# Patient Record
Sex: Male | Born: 1995 | Race: Black or African American | Hispanic: No | Marital: Single
Health system: Southern US, Community
[De-identification: ages and names within clinical notes are randomized; demographics above are authoritative.]

## PROBLEM LIST (undated history)

## (undated) ENCOUNTER — Emergency Department (HOSPITAL_COMMUNITY): Admission: EM | Payer: Self-pay | Source: Home / Self Care

## (undated) DIAGNOSIS — S069XAA Unspecified intracranial injury with loss of consciousness status unknown, initial encounter: Secondary | ICD-10-CM

## (undated) DIAGNOSIS — R569 Unspecified convulsions: Principal | ICD-10-CM

## (undated) DIAGNOSIS — Y249XXA Unspecified firearm discharge, undetermined intent, initial encounter: Secondary | ICD-10-CM

## (undated) DIAGNOSIS — S069X9A Unspecified intracranial injury with loss of consciousness of unspecified duration, initial encounter: Secondary | ICD-10-CM

## (undated) DIAGNOSIS — W3400XA Accidental discharge from unspecified firearms or gun, initial encounter: Secondary | ICD-10-CM

## (undated) DIAGNOSIS — J45909 Unspecified asthma, uncomplicated: Secondary | ICD-10-CM

## (undated) HISTORY — PX: OTHER SURGICAL HISTORY: SHX169

## (undated) HISTORY — PX: BRAIN SURGERY: SHX531

## (undated) HISTORY — PX: ROTATOR CUFF REPAIR: SHX139

## (undated) HISTORY — PX: ANKLE SURGERY: SHX546

---
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
## 2013-04-17 ENCOUNTER — Other Ambulatory Visit (HOSPITAL_COMMUNITY)
Admission: RE | Admit: 2013-04-17 | Discharge: 2013-04-17 | Disposition: A | Discharge status: Home / Self Care | Payer: Medicaid Other | Source: Ambulatory Visit | Attending: Family Medicine | Admitting: Family Medicine

## 2013-04-17 ENCOUNTER — Encounter (HOSPITAL_COMMUNITY): Payer: Self-pay | Admitting: Emergency Medicine

## 2013-04-17 ENCOUNTER — Emergency Department (INDEPENDENT_AMBULATORY_CARE_PROVIDER_SITE_OTHER)
Admission: EM | Admit: 2013-04-17 | Discharge: 2013-04-17 | Disposition: A | Discharge status: Home / Self Care | Payer: Self-pay | Source: Home / Self Care

## 2013-04-17 DIAGNOSIS — Z113 Encounter for screening for infections with a predominantly sexual mode of transmission: Secondary | ICD-10-CM | POA: Insufficient documentation

## 2013-04-17 DIAGNOSIS — Z202 Contact with and (suspected) exposure to infections with a predominantly sexual mode of transmission: Secondary | ICD-10-CM

## 2013-04-17 LAB — POCT URINALYSIS DIP (DEVICE)
Bilirubin Urine: NEGATIVE
GLUCOSE, UA: NEGATIVE mg/dL
HGB URINE DIPSTICK: NEGATIVE
Ketones, ur: NEGATIVE mg/dL
Leukocytes, UA: NEGATIVE
NITRITE: NEGATIVE
PH: 6.5 (ref 5.0–8.0)
Protein, ur: NEGATIVE mg/dL
Specific Gravity, Urine: >=1.03 (ref 1.005–1.030)
UROBILINOGEN UA: 0.2 mg/dL (ref 0.0–1.0)

## 2013-04-17 NOTE — ED Notes (Signed)
Pt wants to be screened for STD He is asymptomatic but is concerned b/c he had abd pressure about a week ago??? Reports he used a contaminated bathroom?? Alert w/no signs of acuate distress.

## 2013-04-17 NOTE — ED Notes (Signed)
Call back number verified.  

## 2013-04-17 NOTE — ED Provider Notes (Signed)
CSN: [id number]     Arrival date & time 04/17/13  1028 History   First MD Initiated Contact with Patient 04/17/13 1152     Chief Complaint  Patient presents with  . Exposure to STD   (Consider location/radiation/quality/duration/timing/severity/associated sxs/prior Treatment) HPI Comments: Last week pt sat down on a "dirty" toilet seat and the next day had episodes of lower abd/pelvic pressure with urination. No penile discharge or dysuria.  No current sx's.   Past Medical History  Diagnosis Date  . Asthma    Past Surgical History  Procedure Laterality Date  . Rotator cuff repair     No family history on file. History  Substance Use Topics  . Smoking status: Never Smoker   . Smokeless tobacco: Not on file  . Alcohol Use: No    Review of Systems  Genitourinary:       See HPI  All other systems reviewed and are negative.   Allergies  Review of patient's allergies indicates no known allergies.  Home Medications   Current Outpatient Rx  Name  Route  Sig  Dispense  Refill  . ibuprofen (ADVIL,MOTRIN) 800 MG tablet   Oral   Take 1 tablet (800 mg total) by mouth 3 (three) times daily.   21 tablet   0    BP 148/79  Pulse 78  Temp(Src) 97 F (36.1 C) (Oral)  Resp 16  SpO2 96% Physical Exam  Nursing note and vitals reviewed. Constitutional: He is oriented to person, place, and time. He appears well-developed and well-nourished. No distress.  Neck: Normal range of motion. Neck supple.  Pulmonary/Chest: Effort normal. He has no wheezes.  Abdominal: He exhibits no distension. There is no tenderness. There is no rebound and no guarding.  Neurological: He is alert and oriented to person, place, and time.  Skin: Skin is warm and dry.  Psychiatric: He has a normal mood and affect.    ED Course  Procedures (including critical care time) Labs Review Labs Reviewed  POCT URINALYSIS DIP (DEVICE)  URINE CYTOLOGY ANCILLARY ONLY   Imaging Review No results  found. Results for orders placed during the hospital encounter of 04/17/13  POCT URINALYSIS DIP (DEVICE)      Result Value Ref Range   Glucose, UA NEGATIVE  NEGATIVE mg/dL   Bilirubin Urine NEGATIVE  NEGATIVE   Ketones, ur NEGATIVE  NEGATIVE mg/dL   Specific Gravity, Urine >=1.030  1.005 - 1.030   Hgb urine dipstick NEGATIVE  NEGATIVE   pH 6.5  5.0 - 8.0   Protein, ur NEGATIVE  NEGATIVE mg/dL   Urobilinogen, UA 0.2  0.0 - 1.0 mg/dL   Nitrite NEGATIVE  NEGATIVE   Leukocytes, UA NEGATIVE  NEGATIVE     MDM   1. Possible exposure to STD     Cytology pending No known exposure and no convincing evidence of current STD Will call report and tx per phone as indicated.     Hayden Rasmussenavid Chanice Brenton, NP 04/17/13 1225

## 2013-04-19 LAB — URINE CYTOLOGY ANCILLARY ONLY
Chlamydia: NEGATIVE
Neisseria Gonorrhea: NEGATIVE
Trichomonas: NEGATIVE

## 2013-04-20 NOTE — ED Provider Notes (Signed)
Medical screening examination/treatment/procedure(s) were performed by a resident physician or non-physician practitioner and as the supervising physician I was immediately available for consultation/collaboration.  Chip Canepa, MD    Dellis Voght S Celestino Ackerman, MD 04/20/13 1636 

## 2013-06-10 ENCOUNTER — Encounter (HOSPITAL_COMMUNITY): Payer: Self-pay | Admitting: Emergency Medicine

## 2013-06-10 ENCOUNTER — Emergency Department (INDEPENDENT_AMBULATORY_CARE_PROVIDER_SITE_OTHER)
Admission: EM | Admit: 2013-06-10 | Discharge: 2013-06-10 | Disposition: A | Discharge status: Home / Self Care | Payer: Self-pay | Source: Home / Self Care | Attending: Emergency Medicine | Admitting: Emergency Medicine

## 2013-06-10 DIAGNOSIS — J00 Acute nasopharyngitis [common cold]: Secondary | ICD-10-CM

## 2013-06-10 LAB — POCT RAPID STREP A: Streptococcus, Group A Screen (Direct): NEGATIVE

## 2013-06-10 MED ORDER — IPRATROPIUM BROMIDE 0.06 % NA SOLN: 2.0000 | NASAL | Status: DC | PRN

## 2013-06-10 NOTE — Discharge Instructions (Signed)

## 2013-06-10 NOTE — ED Provider Notes (Signed)
CSN: [id number]     Arrival date & time 06/10/13  1126 History   None    Chief Complaint  Patient presents with  . URI   (Consider location/radiation/quality/duration/timing/severity/associated sxs/prior Treatment) HPI Comments: 18 year old male presents complaining of a mild to moderate sore throat, itching in the throat, dry cough, and rhinorrhea. His symptoms started 3 days ago. His daughter has been sick with similar symptoms. He denies fever, chills, NVD, body aches. Cough is dry, nonproductive. No chest pain or shortness of breath.  Patient is a 18 y.o. male presenting with URI.  URI Presenting symptoms: congestion, cough, rhinorrhea and sore throat   Presenting symptoms: no fatigue and no fever     Past Medical History  Diagnosis Date  . Asthma    Past Surgical History  Procedure Laterality Date  . Rotator cuff repair     History reviewed. No pertinent family history. History  Substance Use Topics  . Smoking status: Never Smoker   . Smokeless tobacco: Not on file  . Alcohol Use: No    Review of Systems  Constitutional: Negative for fever, chills and fatigue.  HENT: Positive for congestion, rhinorrhea and sore throat.   Respiratory: Positive for cough. Negative for chest tightness and shortness of breath.   All other systems reviewed and are negative.   Allergies  Review of patient's allergies indicates no known allergies.  Home Medications   Prior to Admission medications   Medication Sig Start Date End Date Taking? Authorizing Provider  ibuprofen (ADVIL,MOTRIN) 800 MG tablet Take 1 tablet (800 mg total) by mouth 3 (three) times daily. 10/10/12   Fayrene Helper, PA-C  ipratropium (ATROVENT) 0.06 % nasal spray Place 2 sprays into both nostrils every 4 (four) hours as needed for rhinitis. 06/10/13   Adrian Blackwater Layza Summa, PA-C   BP 149/82  Pulse 64  Temp(Src) 97.5 F (36.4 C) (Oral)  Resp 16  SpO2 100% Physical Exam  Nursing note and vitals reviewed. Constitutional: He  is oriented to person, place, and time. He appears well-developed and well-nourished. No distress.  HENT:  Head: Normocephalic and atraumatic.  Nose: Nasal deformity (mass off the lateral portion of the left nostril, 1 cm superior from the opening of the naris ) present.  Mouth/Throat: Uvula is midline. Posterior oropharyngeal erythema present. Oropharyngeal exudate: embedded tonsil stones, but no exudate.  Cardiovascular: Normal rate, regular rhythm and normal heart sounds.   Pulmonary/Chest: Effort normal and breath sounds normal. No respiratory distress.  Lymphadenopathy:       Head (right side): Tonsillar adenopathy present.       Head (left side): Tonsillar adenopathy present.    He has no cervical adenopathy.  Neurological: He is alert and oriented to person, place, and time. Coordination normal.  Skin: Skin is warm and dry. No rash noted. He is not diaphoretic.  Psychiatric: He has a normal mood and affect. Judgment normal.    ED Course  Procedures (including critical care time) Labs Review Labs Reviewed  POCT RAPID STREP A ([facility] CARE ONLY)    Imaging Review No results found.   MDM   1. Acute nasopharyngitis (common cold)    Referring to ENT for evaluation of the mass in the left nostril. As for his current complaint, he has a cold. We'll prescribe Atrovent nasal spray to use as needed for runny nose. Followup if worsening   Meds ordered this encounter  Medications  . ipratropium (ATROVENT) 0.06 % nasal spray    Sig: Place 2  sprays into both nostrils every 4 (four) hours as needed for rhinitis.    Dispense:  15 mL    Refill:  1    Order Specific Question:  Supervising Provider    Answer:  Bradd CanaryKINDL, JAMES D [[id number]]       Graylon GoodZachary H Zach Tietje, PA-C 06/10/13 1314

## 2013-06-10 NOTE — ED Notes (Signed)
C/o   Sore/scratchy throat.  Cough.  Runny nose.  Symptoms present since 6/1.  Denies vomiting and diarrhea.

## 2013-06-11 NOTE — ED Provider Notes (Signed)
Medical screening examination/treatment/procedure(s) were performed by a resident physician or non-physician practitioner and as the supervising physician I was immediately available for consultation/collaboration.  Evan Corey, MD    Evan S Corey, MD 06/11/13 0743 

## 2013-06-12 LAB — CULTURE, GROUP A STREP

## 2013-07-12 ENCOUNTER — Emergency Department (HOSPITAL_COMMUNITY): Payer: Medicaid Other

## 2013-07-12 ENCOUNTER — Emergency Department (HOSPITAL_COMMUNITY)
Admission: EM | Admit: 2013-07-12 | Discharge: 2013-07-12 | Disposition: A | Discharge status: Home / Self Care | Payer: Medicaid Other | Attending: Emergency Medicine | Admitting: Emergency Medicine

## 2013-07-12 ENCOUNTER — Encounter (HOSPITAL_COMMUNITY): Payer: Self-pay | Admitting: Emergency Medicine

## 2013-07-12 DIAGNOSIS — Z791 Long term (current) use of non-steroidal anti-inflammatories (NSAID): Secondary | ICD-10-CM | POA: Diagnosis not present

## 2013-07-12 DIAGNOSIS — M79609 Pain in unspecified limb: Secondary | ICD-10-CM | POA: Diagnosis present

## 2013-07-12 DIAGNOSIS — Z79899 Other long term (current) drug therapy: Secondary | ICD-10-CM | POA: Insufficient documentation

## 2013-07-12 DIAGNOSIS — J45909 Unspecified asthma, uncomplicated: Secondary | ICD-10-CM | POA: Insufficient documentation

## 2013-07-12 DIAGNOSIS — M25579 Pain in unspecified ankle and joints of unspecified foot: Secondary | ICD-10-CM | POA: Diagnosis not present

## 2013-07-12 DIAGNOSIS — M25572 Pain in left ankle and joints of left foot: Secondary | ICD-10-CM

## 2013-07-12 DIAGNOSIS — M722 Plantar fascial fibromatosis: Secondary | ICD-10-CM | POA: Diagnosis not present

## 2013-07-12 MED ORDER — MELOXICAM 7.5 MG PO TABS: 7.5000 mg | ORAL_TABLET | Freq: Every day | ORAL | Status: DC

## 2013-07-12 MED ORDER — MELOXICAM 15 MG PO TABS: 15.0000 mg | ORAL_TABLET | Freq: Every day | ORAL | Status: DC

## 2013-07-12 MED ORDER — IBUPROFEN 400 MG PO TABS
800.0000 mg | ORAL_TABLET | Freq: Once | ORAL | Status: AC
  Administered 2013-07-12: 800 mg via ORAL
  Filled 2013-07-12: qty 2

## 2013-07-12 NOTE — ED Notes (Addendum)
Per pt sts left foot pain for the past few days with swelling denies injury. No redness or warmth. sts that the pain is in his heel when he walks.

## 2013-07-12 NOTE — ED Provider Notes (Signed)
Medical screening examination/treatment/procedure(s) were performed by non-physician practitioner and as supervising physician I was immediately available for consultation/collaboration.   EKG Interpretation None        Torben Soloway M Tatiyana Foucher, MD 07/12/13 1534 

## 2013-07-12 NOTE — ED Notes (Signed)
Pt reports he woke 2 days ago with left foot/ankle swelling and pain to heel and arch of foot. No known injury. Foot significantly swollen.

## 2013-07-12 NOTE — Discharge Instructions (Signed)
Please read and follow all provided instructions.  Your diagnoses today include:  1. Plantar fasciitis of left foot   2. Left ankle pain     Tests performed today include:  An x-ray of the affected area - does NOT show any broken bones  Vital signs. See below for your results today.   Medications prescribed:   Meloxicam - anti-inflammatory pain medication  You have been prescribed an anti-inflammatory medication or NSAID. Take with food. Do not take aspirin, ibuprofen, or naproxen if taking this medication. Take smallest effective dose for the shortest duration needed for your pain. Stop taking if you experience stomach pain or vomiting.   Take any prescribed medications only as directed.  Home care instructions:   Follow any educational materials contained in this packet  Follow R.I.C.E. Protocol:  R - rest your injury   I  - use ice on injury without applying directly to skin  C - compress injury with bandage or splint  E - elevate the injury as much as possible  Follow-up instructions: Please follow-up with your primary care provider if you continue to have significant pain or trouble walking in 1 week. In this case you may have a severe injury that requires further care.   Return instructions:   Please return if your toes are numb or tingling, appear gray or blue, or you have severe pain (also elevate leg and loosen splint or wrap if you were given one)  Please return to the Emergency Department if you experience worsening symptoms.   Please return if you have any other emergent concerns.  Additional Information:  Your vital signs today were: BP 133/71   Pulse 74   Temp(Src) 98.2 F (36.8 C)   Resp 18   SpO2 100% If your blood pressure (BP) was elevated above 135/85 this visit, please have this repeated by your doctor within one month. --------------

## 2013-07-12 NOTE — ED Provider Notes (Signed)
CSN: [id number]     Arrival date & time 07/12/13  0854 History  This chart was scribed for non-physician practitioner Thomas CriglerJoshua Burnis Carter working with Thomas SkeensJoshua M Zavitz, MD by Thomas Carter, ED Scribe. This patient was seen in room TR05C/TR05C and the patient's care was started at 9:23 AM.    Chief Complaint  Patient presents with  . Foot Pain    HPI Comments: Thomas Carter is a 18 y.o. male who presents to the Emergency Department complaining of constant left foot pain that started 3 days ago after the patient woke up and noticed his foot was swollen.  He states that the swelling has gradually improved over the last 3 days.  The patient denies any injury and states that he can apply weight to his foot but experiences a pain that he rates as a 7/10.  He denies taking any medication for his symptoms.  He states that he works in [employer]eron[employer].     Patient is a 18 y.o. male presenting with lower extremity pain. The history is provided by the patient. No language interpreter was used.  Foot Pain    Past Medical History  Diagnosis Date  . Asthma    Past Surgical History  Procedure Laterality Date  . Rotator cuff repair     History reviewed. No pertinent family history. History  Substance Use Topics  . Smoking status: Never Smoker   . Smokeless tobacco: Not on file  . Alcohol Use: No    Review of Systems  Constitutional: Negative for activity change.  Musculoskeletal: Positive for arthralgias, gait problem and joint swelling. Negative for back pain and neck pain.  Skin: Negative for wound.  Neurological: Negative for weakness and numbness.      Allergies  Review of patient's allergies indicates no known allergies.  Home Medications   Prior to Admission medications   Medication Sig Start Date End Date Taking? Authorizing Provider  ibuprofen (ADVIL,MOTRIN) 800 MG tablet Take 1 tablet (800 mg total) by mouth 3 (three) times daily. 10/10/12   Thomas HelperBowie Tran, PA-C  ipratropium (ATROVENT) 0.06 %  nasal spray Place 2 sprays into both nostrils every 4 (four) hours as needed for rhinitis. 06/10/13   Thomas GoodZachary H Baker, PA-C   Triage Vitals: BP 133/71  Pulse 74  Temp(Src) 98.2 F (36.8 C)  Resp 18  SpO2 100%  Physical Exam  Nursing note and vitals reviewed. Constitutional: He is oriented to person, place, and time. He appears well-developed and well-nourished.  HENT:  Head: Normocephalic and atraumatic.  Eyes: Conjunctivae and EOM are normal.  Neck: Normal range of motion. Neck supple.  Cardiovascular: Normal rate and normal pulses.   Pulses:      Dorsalis pedis pulses are 2+ on the left side.  Pulmonary/Chest: Effort normal.  Musculoskeletal: He exhibits tenderness. He exhibits no edema.       Left knee: Normal.       Left ankle: He exhibits decreased range of motion and swelling. Tenderness. Lateral malleolus tenderness found. No proximal fibula tenderness found. Achilles tendon normal.       Left lower leg: Normal.       Left foot: He exhibits decreased range of motion and tenderness. He exhibits no bony tenderness.       Feet:  Neurological: He is alert and oriented to person, place, and time. No sensory deficit.  Motor, sensation, and vascular distal to the injury is fully intact.   Skin: Skin is warm and dry.  Psychiatric: He has  a normal mood and affect. His behavior is normal.    ED Course  Procedures (including critical care time)  DIAGNOSTIC STUDIES: Oxygen Saturation is 100% on room air, normal by my interpretation.    COORDINATION OF CARE: 9:40 AM-Discussed a clinical suspicion of inflammation in the plantar fascia and obtaining an x-ray of the patient's left foot.  Advised the patient to elevate his left foot throughout the day and at night, take antiinflammatory medication, and do gentle stretching exercises.  The patient agreed to the treatment plan.   Labs Review Labs Reviewed - No data to display  Imaging Review No results found.   EKG  Interpretation None      Vital signs reviewed and are as follows: Filed Vitals:   07/12/13 0917  BP: 133/71  Pulse: 74  Temp: 98.2 F (36.8 C)  Resp: 18   Pt informed of x-ray findings. Counseled on RICE, NSAIDs.   PCP f/u if not improved in 1 week.   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 However patient does have moderate swelling to his ankle and for this reason an x-ray was ordered. X-rays negative. Will treat patient for plantar fasciitis. He was counseled on rice protocol. PCP followup in one week if not improved.  I personally performed the services described in this documentation, which was scribed in my presence. The recorded information has been reviewed and is accurate.    Thomas CriglerJoshua Gaile Allmon, PA-C 07/12/13 1048

## 2013-08-04 ENCOUNTER — Encounter (HOSPITAL_COMMUNITY): Payer: Self-pay | Admitting: Emergency Medicine

## 2013-08-04 ENCOUNTER — Emergency Department (HOSPITAL_COMMUNITY)
Admission: EM | Admit: 2013-08-04 | Discharge: 2013-08-04 | Disposition: A | Discharge status: Home / Self Care | Payer: Medicaid Other | Attending: Emergency Medicine | Admitting: Emergency Medicine

## 2013-08-04 DIAGNOSIS — J45909 Unspecified asthma, uncomplicated: Secondary | ICD-10-CM | POA: Diagnosis not present

## 2013-08-04 DIAGNOSIS — R269 Unspecified abnormalities of gait and mobility: Secondary | ICD-10-CM | POA: Diagnosis not present

## 2013-08-04 DIAGNOSIS — M79609 Pain in unspecified limb: Secondary | ICD-10-CM | POA: Diagnosis present

## 2013-08-04 DIAGNOSIS — M7989 Other specified soft tissue disorders: Secondary | ICD-10-CM

## 2013-08-04 LAB — CBC
HEMATOCRIT: 43.8 % (ref 39.0–52.0)
HEMOGLOBIN: 15.4 g/dL (ref 13.0–17.0)
MCH: 30.5 pg (ref 26.0–34.0)
MCHC: 35.2 g/dL (ref 30.0–36.0)
MCV: 86.7 fL (ref 78.0–100.0)
Platelets: 218 10*3/uL (ref 150–400)
RBC: 5.05 MIL/uL (ref 4.22–5.81)
RDW: 12.7 % (ref 11.5–15.5)
WBC: 6.5 10*3/uL (ref 4.0–10.5)

## 2013-08-04 LAB — BASIC METABOLIC PANEL
ANION GAP: 14 (ref 5–15)
BUN: 13 mg/dL (ref 6–23)
CHLORIDE: 103 meq/L (ref 96–112)
CO2: 23 meq/L (ref 19–32)
CREATININE: 1.11 mg/dL (ref 0.50–1.35)
Calcium: 9.2 mg/dL (ref 8.4–10.5)
GFR calc Af Amer: >90 mL/min (ref >90)
GFR calc non Af Amer: >90 mL/min (ref >90)
Glucose, Bld: 89 mg/dL (ref 70–99)
Potassium: 4.1 mEq/L (ref 3.7–5.3)
Sodium: 140 mEq/L (ref 137–147)

## 2013-08-04 MED ORDER — ENOXAPARIN SODIUM 120 MG/0.8ML ~~LOC~~ SOLN
120.0000 mg | Freq: Once | SUBCUTANEOUS | Status: AC
  Administered 2013-08-04: 120 mg via SUBCUTANEOUS
  Filled 2013-08-04: qty 0.8

## 2013-08-04 NOTE — ED Notes (Signed)
Pt was seen here on 7/6 for left ankle pain and swelling. Reports taking meds as prescribed but still having swelling and pain when ambulating. No redness or warmth noted to leg, denies fever.

## 2013-08-04 NOTE — ED Notes (Signed)
Pt. Verbalized understanding of needing to return tomorrow morning for doppler study.

## 2013-08-04 NOTE — ED Provider Notes (Signed)
CSN: [id number]     Arrival date & time 08/04/13  1700 History  This chart was scribed for Mellody Drown, PA-C working with Flint Melter, MD by Evon Slack, ED Scribe. This patient was seen in room TR09C/TR09C and the patient's care was started at 6:45 PM.    Chief Complaint  Patient presents with  . Leg Swelling  . Leg Pain   HPI Comments: Thomas Carter is a 18 y.o. male who presents to the Emergency Department complaining of left leg swelling onset 07/10/2013. He denies history of gout. He states he took ibuprofen with for 1 week with no relief. He states that he has pain while walking. He denies any injury to the ankle. He states the he feels some tingling every time the foot is touched. He states the swelling has decreased but also thinks that it has traveled up his leg. No recent travel, family history or personal history of DVT/PE, lower extremity swelling, smoking, cancer, or exogenous estrogen.    Patient is a 18 y.o. male presenting with leg pain. The history is provided by the patient. No language interpreter was used.  Leg Pain Associated symptoms: no fever      Past Medical History  Diagnosis Date  . Asthma    Past Surgical History  Procedure Laterality Date  . Rotator cuff repair     History reviewed. No pertinent family history. History  Substance Use Topics  . Smoking status: Never Smoker   . Smokeless tobacco: Not on file  . Alcohol Use: No    Review of Systems  Constitutional: Negative for fever and chills.  Respiratory: Negative for shortness of breath.   Cardiovascular: Positive for leg swelling. Negative for chest pain and palpitations.  Musculoskeletal: Positive for arthralgias and gait problem.  Skin: Negative for color change, rash and wound.  Neurological: Negative for numbness.   Allergies  Review of patient's allergies indicates no known allergies.  Home Medications   Prior to Admission medications   Not on File   Triage Vitals: BP  141/74  Pulse 87  Temp(Src) 98.1 F (36.7 C) (Oral)  Resp 18  SpO2 98%  Physical Exam  Nursing note and vitals reviewed. Constitutional: He is oriented to person, place, and time. He appears well-developed and well-nourished.  Non-toxic appearance. He does not have a sickly appearance. He does not appear ill. No distress.  HENT:  Head: Normocephalic and atraumatic.  Eyes: Conjunctivae and EOM are normal.  Neck: Neck supple.  Pulmonary/Chest: Effort normal. No respiratory distress.  Musculoskeletal: Normal range of motion.  Left lower edema extending to mid tibia. Good cap refill, normal sensation to leg and toes, no obvious deformity, no obvious lesions, no increase in temperature to touch, no overlying erythema.   Neurological: He is alert and oriented to person, place, and time.  Skin: Skin is warm and dry. He is not diaphoretic.  Psychiatric: He has a normal mood and affect. His behavior is normal.    ED Course  Procedures (including critical care time) DIAGNOSTIC STUDIES: Oxygen Saturation is 98% on RA, normal by my interpretation.    COORDINATION OF CARE: 6:51 PM-Discussed treatment plan which includes ultra sound of left leg with pt at bedside and pt agreed to plan.     Labs Review Labs Reviewed  BASIC METABOLIC PANEL  CBC    Imaging Review No results found.   EKG Interpretation None      MDM   Final diagnoses:  Left leg swelling  Pt presents with non-traumatic left leg swelling, persistent for several weeks.  No obvious cellulitis, no signs of joint infection. pain to plantar surface with ambulation with bear feet. US ordered. US order was placed prior to 1900, when US leaves, however the no one from US came to get the patient, called and there were no more staff in the office.  Discussed lovenox and return at 0800 tomorrow for US study. Discussed lab results, imaging results, and treatment plan with the patient. Return precautions given. Reports  understanding and no other concerns at this time.  Patient is stable for discharge at this time. Meds given in ED:  Medications  enoxaparin (LOVENOX) injection 120 mg (120 mg Subcutaneous Given 08/04/13 2125)    There are no discharge medications for this patient.  I personally performed the services described in this documentation, which was scribed in my presence. The recorded information has been reviewed and is accurate.      Clabe SealLauren M Cline Draheim, PA-C 08/06/13 [phone]

## 2013-08-04 NOTE — Discharge Instructions (Signed)
Return for your outpatient DVT study as indicated by instructions on discharge paper. Call for a follow up appointment with a Family or Primary Care Provider.  Return if Symptoms worsen, he develop shortness of breath, chest pain, worsening swelling in the leg, numbness or tingling to the leg. Take medication as prescribed.  Elevate your leg when you're not walking or standing. Ice 3-4 times a day.

## 2013-08-04 NOTE — Progress Notes (Addendum)
ANTICOAGULATION CONSULT NOTE - Initial Consult  Pharmacy Consult:  Lovenox Indication:  Rule out DVT  No Known Allergies  Patient Measurements: Height: 6\' 3"  (190.5 cm) Weight: 255 lb (115.667 kg) IBW/kg (Calculated) : 84.5  Vital Signs: Temp: 98.1 F (36.7 C) (07/29 1713) Temp src: Oral (07/29 1713) BP: 141/74 mmHg (07/29 1713) Pulse Rate: 87 (07/29 1713)  Labs: No results found for this basename: HGB, HCT, PLT, APTT, LABPROT, INR, HEPARINUNFRC, CREATININE, CKTOTAL, CKMB, TROPONINI,  in the last 72 hours  Estimated Creatinine Clearance: 158.3 ml/min (by C-G formula based on Cr of 1.03).   Medical History: Past Medical History  Diagnosis Date  . Asthma        Assessment: [age] YOM presented with ankle pain and swelling to receive one dose of Lovenox and return tomorrow for an ultrasound.  Labs pending collection.   Goal of Therapy:  Anti-Xa level 0.6-1 units/ml 4hrs after LMWH dose given Monitor platelets by anticoagulation protocol: Yes    Plan:  - After labs are drawn, give Lovenox 120mg  SQ x 1 - Further plans per PA/MD    Chelsea Aushuy D. Laney Potashang, PharmD, BCPS Pager:  [phone] 08/04/2013, 9:01 PM    ==================================   Addendum:  labs WNL    Yzabella Crunk D. Laney Potashang, PharmD, BCPS Pager:  [phone] - [phone] 08/04/2013, 9:57 PM

## 2013-08-05 ENCOUNTER — Ambulatory Visit (HOSPITAL_COMMUNITY)
Admission: RE | Admit: 2013-08-05 | Discharge: 2013-08-05 | Disposition: A | Discharge status: Home / Self Care | Payer: Medicaid Other | Source: Ambulatory Visit | Attending: Family Medicine | Admitting: Family Medicine

## 2013-08-05 DIAGNOSIS — M7989 Other specified soft tissue disorders: Secondary | ICD-10-CM | POA: Diagnosis not present

## 2013-08-05 DIAGNOSIS — M79609 Pain in unspecified limb: Secondary | ICD-10-CM | POA: Diagnosis present

## 2013-08-05 NOTE — Progress Notes (Signed)
*  Preliminary Results* Left lower extremity venous duplex completed. Left lower extremity is negative for deep vein thrombosis. There is no evidence of left Baker's cyst.  08/05/2013 2:40 PM  Gertie FeyMichelle Malekai Markwood, RVT, RDCS, RDMS

## 2013-08-06 NOTE — ED Provider Notes (Signed)
Medical screening examination/treatment/procedure(s) were performed by non-physician practitioner and as supervising physician I was immediately available for consultation/collaboration.   EKG Interpretation None       Demoni Gergen L Fiora Weill, MD 08/06/13 1553 

## 2013-08-16 ENCOUNTER — Encounter: Payer: Self-pay | Admitting: Internal Medicine

## 2013-08-16 ENCOUNTER — Ambulatory Visit: Payer: Medicaid Other | Attending: Internal Medicine | Admitting: Internal Medicine

## 2013-08-16 VITALS — BP 117/74 | HR 76 | Temp 97.8°F | Resp 16

## 2013-08-16 DIAGNOSIS — Z8709 Personal history of other diseases of the respiratory system: Secondary | ICD-10-CM

## 2013-08-16 DIAGNOSIS — M25476 Effusion, unspecified foot: Principal | ICD-10-CM | POA: Insufficient documentation

## 2013-08-16 DIAGNOSIS — F17200 Nicotine dependence, unspecified, uncomplicated: Secondary | ICD-10-CM | POA: Diagnosis not present

## 2013-08-16 DIAGNOSIS — M25472 Effusion, left ankle: Secondary | ICD-10-CM

## 2013-08-16 DIAGNOSIS — M25473 Effusion, unspecified ankle: Secondary | ICD-10-CM | POA: Diagnosis present

## 2013-08-16 DIAGNOSIS — IMO0001 Reserved for inherently not codable concepts without codable children: Secondary | ICD-10-CM

## 2013-08-16 HISTORY — DX: Nicotine dependence, unspecified, uncomplicated: F17.200

## 2013-08-16 HISTORY — DX: Reserved for inherently not codable concepts without codable children: IMO0001

## 2013-08-16 LAB — URIC ACID: Uric Acid, Serum: 6.5 mg/dL (ref 4.0–7.8)

## 2013-08-16 MED ORDER — IBUPROFEN 800 MG PO TABS: 800.0000 mg | ORAL_TABLET | Freq: Three times a day (TID) | ORAL | Status: DC | PRN

## 2013-08-16 NOTE — Progress Notes (Signed)
Pt here to f/u left leg swelling with negative DVT on u/s 08/05/13 Pt was given Lovenox injection and states he feels much better Denies injury. Swelling noted to left ankle and lower leg Denies sob or chest pain

## 2013-08-16 NOTE — Progress Notes (Signed)
Patient Demographics  Thomas Carter, is a 18 y.o. male  ZOX:[id number]  [id number]  DOB - Apr 29, 1995  CC:  Chief Complaint  Patient presents with  . Hospitalization Follow-up  . Leg Swelling       HPI: Thomas Carter is a 18 y.o. male here today to establish medical care.He recently went to the emergency room with symptoms of left lower extremity swelling for the last one month, EMR reviewed patient did not had any symptoms of cellulitis, patient had her an x-ray done which was negative for bony abnormality or degenerative changes also had an ultrasound done which is negative for DVT, patient denies any family history of DVT PE. Patient reports some improvement in the symptoms but still has persistent swelling, denies any fever chills chest and shortness of breath denies any orthopnea or PND, patient has history of asthma denies any wheezing, but smoke cigarettes, I have advised patient to quit smoking. Patient has history of right ankle surgery and has metallic screws and hence cannot get MRI. Patient has No headache, No chest pain, No abdominal pain - No Nausea, No new weakness tingling or numbness, No Cough - SOB.  No Known Allergies Past Medical History  Diagnosis Date  . Asthma    No current outpatient prescriptions on file prior to visit.   No current facility-administered medications on file prior to visit.   Family History  Problem Relation Age of Onset  . Diabetes Maternal Uncle    History   Social History  . Marital Status: Single    Spouse Name: N/A    Number of Children: N/A  . Years of Education: N/A   Occupational History  . Not on file.   Social History Main Topics  . Smoking status: Current Every Day Smoker -- 0.50 packs/day for 5 years    Types: Cigarettes  . Smokeless tobacco: Not on file  . Alcohol Use: Yes     Comment: socially  . Drug Use: No  . Sexual Activity: Yes   Other Topics Concern  . Not on file   Social History Narrative  . No  narrative on file    Review of Systems: Constitutional: Negative for fever, chills, diaphoresis, activity change, appetite change and fatigue. HENT: Negative for ear pain, nosebleeds, congestion, facial swelling, rhinorrhea, neck pain, neck stiffness and ear discharge.  Eyes: Negative for pain, discharge, redness, itching and visual disturbance. Respiratory: Negative for cough, choking, chest tightness, shortness of breath, wheezing and stridor.  Cardiovascular: Negative for chest pain, palpitations and leg swelling. Gastrointestinal: Negative for abdominal distention. Genitourinary: Negative for dysuria, urgency, frequency, hematuria, flank pain, decreased urine volume, difficulty urinating and dyspareunia.  Musculoskeletal: Negative for back pain, joint swelling, arthralgia and gait problem. Neurological: Negative for dizziness, tremors, seizures, syncope, facial asymmetry, speech difficulty, weakness, light-headedness, numbness and headaches.  Hematological: Negative for adenopathy. Does not bruise/bleed easily. Psychiatric/Behavioral: Negative for hallucinations, behavioral problems, confusion, dysphoric mood, decreased concentration and agitation.    Objective:   Filed Vitals:   08/16/13 0930  BP: 117/74  Pulse: 76  Temp: 97.8 F (36.6 C)  Resp: 16    Physical Exam: Constitutional: Patient appears well-developed and well-nourished. No distress. HENT: Normocephalic, atraumatic, External right and left ear normal. Oropharynx is clear and moist.  Eyes: Conjunctivae and EOM are normal. PERRLA, no scleral icterus. Neck: Normal ROM. Neck supple. No JVD. No tracheal deviation. No thyromegaly. CVS: RRR, S1/S2 +, no murmurs, no gallops, no carotid bruit.  Pulmonary: Effort  and breath sounds normal, no stridor, rhonchi, wheezes, rales.  Abdominal: Soft. BS +, no distension, tenderness, rebound or guarding.  Musculoskeletal: Normal range of motion. Left lower leg/ankle edema 1+ no  tender no warmth to touch. Neuro: Alert. Normal reflexes, muscle tone coordination. No cranial nerve deficit. Skin: Skin is warm and dry. No rash noted. Not diaphoretic. No erythema. No pallor. Psychiatric: Normal mood and affect. Behavior, judgment, thought content normal.  Lab Results  Component Value Date   WBC 6.5 08/04/2013   HGB 15.4 08/04/2013   HCT 43.8 08/04/2013   MCV 86.7 08/04/2013   PLT 218 08/04/2013   Lab Results  Component Value Date   CREATININE 1.11 08/04/2013   BUN 13 08/04/2013   NA 140 08/04/2013   K 4.1 08/04/2013   CL 103 08/04/2013   CO2 23 08/04/2013    No results found for this basename: HGBA1C   Lipid Panel  No results found for this basename: chol, trig, hdl, cholhdl, vldl, ldlcalc       Assessment and plan:   1. Left ankle swelling Left leg ultrasound negative for DVT, x-ray reported to have soft tissue swelling recommended to have an MRI but  patient has history of right ankle surgery with metal screws I have ordered CT scan for further evaluation, advised patient for compression leg elevation and ibuprofen when necessary for pain and swelling. Will also check uric acid  level. - CT Ankle Left W Wo Contrast; Future - ibuprofen (ADVIL,MOTRIN) 800 MG tablet; Take 1 tablet (800 mg total) by mouth every 8 (eight) hours as needed.  Dispense: 30 tablet; Refill: 1 - Uric Acid  2. History of asthma Asymptomatic  3. Smoking Advise patient to quit smoking   Return in about 3 months (around 11/16/2013).   Doris CheadleADVANI, Courvoisier Hamblen, MD

## 2013-08-17 ENCOUNTER — Telehealth: Payer: Self-pay | Admitting: Emergency Medicine

## 2013-08-17 NOTE — Telephone Encounter (Signed)
Left message for pt to call for lab results 

## 2013-08-17 NOTE — Telephone Encounter (Signed)
Message copied by Darlis LoanSMITH, JILL D on Tue Aug 17, 2013  4:36 PM ------      Message from: Doris CheadleADVANI, DEEPAK      Created: Tue Aug 17, 2013 12:07 PM       Call and let the patient know that his uric acid is in normal range ------

## 2013-12-10 ENCOUNTER — Emergency Department (INDEPENDENT_AMBULATORY_CARE_PROVIDER_SITE_OTHER)
Admission: EM | Admit: 2013-12-10 | Discharge: 2013-12-10 | Disposition: A | Discharge status: Home / Self Care | Payer: Medicaid Other | Source: Home / Self Care | Attending: Emergency Medicine | Admitting: Emergency Medicine

## 2013-12-10 ENCOUNTER — Encounter (HOSPITAL_COMMUNITY): Payer: Self-pay | Admitting: Emergency Medicine

## 2013-12-10 DIAGNOSIS — B9789 Other viral agents as the cause of diseases classified elsewhere: Principal | ICD-10-CM

## 2013-12-10 DIAGNOSIS — J069 Acute upper respiratory infection, unspecified: Secondary | ICD-10-CM

## 2013-12-10 MED ORDER — ALBUTEROL SULFATE HFA 108 (90 BASE) MCG/ACT IN AERS: 2.0000 | INHALATION_SPRAY | RESPIRATORY_TRACT | Status: DC | PRN

## 2013-12-10 MED ORDER — TRAMADOL HCL 50 MG PO TABS
50.0000 mg | ORAL_TABLET | Freq: Four times a day (QID) | ORAL | Status: DC | PRN
Start: 2013-12-10 — End: 2014-01-28

## 2013-12-10 NOTE — ED Notes (Signed)
Pt reports cough x2days that wakes him up from sleep.  He states he is coughing up some mucus. He denies any fever.

## 2013-12-10 NOTE — Discharge Instructions (Signed)
You have a virus. Take tramadol every 6 hours as needed for cough and pain. Use the albuterol every 4 hours as needed for coughing. You can use Chloraseptic spray or throat lozenges to help with the sore throat.  Once your congestion clears up, your throat will feel better.  If you develop fevers, trouble swallowing, or trouble breathing, please go to the emergency room.

## 2013-12-10 NOTE — ED Provider Notes (Signed)
CSN: [id number]     Arrival date & time 12/10/13  1353 History   First MD Initiated Contact with Patient 12/10/13 1407     Chief Complaint  Patient presents with  . Cough   (Consider location/radiation/quality/duration/timing/severity/associated sxs/prior Treatment) HPI  He is a 18 year old man here for evaluation of chest congestion. This is associated with some coughing, productive of mucus, and a sore throat. It is been going on for about 2 days. He also reports some discomfort in his chest when he coughs, described as a raw feeling. Denies any runny nose. No fevers or chills. No wheezing or shortness of breath. He does have a history of asthma, but has not used an inhaler in 3 or 4 years. His son was recently sick with a cold.  Past Medical History  Diagnosis Date  . Asthma    Past Surgical History  Procedure Laterality Date  . Rotator cuff repair    . Right ankle surgery      Family History  Problem Relation Age of Onset  . Diabetes Maternal Uncle    History  Substance Use Topics  . Smoking status: Current Every Day Smoker -- 0.50 packs/day for 5 years    Types: Cigarettes  . Smokeless tobacco: Not on file  . Alcohol Use: Yes     Comment: socially    Review of Systems  Constitutional: Negative for fever.  HENT: Positive for sore throat. Negative for congestion and rhinorrhea.   Respiratory: Positive for cough. Negative for shortness of breath and wheezing.   Cardiovascular: Positive for chest pain (with cough).    Allergies  Review of patient's allergies indicates no known allergies.  Home Medications   Prior to Admission medications   Medication Sig Start Date End Date Taking? Authorizing Provider  albuterol (PROVENTIL HFA;VENTOLIN HFA) 108 (90 BASE) MCG/ACT inhaler Inhale 2 puffs into the lungs every 4 (four) hours as needed for wheezing or shortness of breath (cough). 12/10/13   Charm RingsErin J Yanixan Mellinger, MD  ibuprofen (ADVIL,MOTRIN) 800 MG tablet Take 1 tablet (800 mg  total) by mouth every 8 (eight) hours as needed. 08/16/13   Doris Cheadleeepak Advani, MD  traMADol (ULTRAM) 50 MG tablet Take 1 tablet (50 mg total) by mouth every 6 (six) hours as needed (cough or pain). 12/10/13   Charm RingsErin J Gerard Cantara, MD   BP 115/69 mmHg  Pulse 79  Temp(Src) 98.6 F (37 C) (Oral)  Resp 18  SpO2 96% Physical Exam  Constitutional: He is oriented to person, place, and time. He appears well-developed and well-nourished. No distress.  HENT:  Head: Normocephalic and atraumatic.  Right Ear: External ear normal.  Left Ear: External ear normal.  Nose: Mucosal edema present. No rhinorrhea.  Mouth/Throat: Mucous membranes are not dry. Posterior oropharyngeal erythema present. No oropharyngeal exudate.  Neck: Neck supple.  Cardiovascular: Normal rate, regular rhythm and normal heart sounds.   No murmur heard. Pulmonary/Chest: Effort normal and breath sounds normal. No respiratory distress. He has no wheezes. He has no rales. He exhibits no tenderness.  Lymphadenopathy:    He has no cervical adenopathy.  Neurological: He is alert and oriented to person, place, and time.    ED Course  Procedures (including critical care time) Labs Review Labs Reviewed - No data to display  Imaging Review No results found.   MDM   1. Viral URI with cough    Symptomatic treatment with tramadol for discomfort and cough. Also provided albuterol inhaler to use as needed for coughing. Return  precautions reviewed as in after visit summary.    Charm RingsErin J Medardo Hassing, MD 12/10/13 [phone]

## 2014-01-28 ENCOUNTER — Emergency Department (HOSPITAL_COMMUNITY)
Admission: EM | Admit: 2014-01-28 | Discharge: 2014-01-28 | Disposition: A | Discharge status: Home / Self Care | Payer: Medicaid Other | Attending: Emergency Medicine | Admitting: Emergency Medicine

## 2014-01-28 ENCOUNTER — Encounter (HOSPITAL_COMMUNITY): Payer: Self-pay | Admitting: Emergency Medicine

## 2014-01-28 DIAGNOSIS — R000 Tachycardia, unspecified: Secondary | ICD-10-CM | POA: Insufficient documentation

## 2014-01-28 DIAGNOSIS — R1084 Generalized abdominal pain: Secondary | ICD-10-CM | POA: Diagnosis present

## 2014-01-28 DIAGNOSIS — K529 Noninfective gastroenteritis and colitis, unspecified: Secondary | ICD-10-CM

## 2014-01-28 DIAGNOSIS — Z79899 Other long term (current) drug therapy: Secondary | ICD-10-CM | POA: Diagnosis not present

## 2014-01-28 DIAGNOSIS — Z720 Tobacco use: Secondary | ICD-10-CM | POA: Diagnosis not present

## 2014-01-28 DIAGNOSIS — J45909 Unspecified asthma, uncomplicated: Secondary | ICD-10-CM | POA: Diagnosis not present

## 2014-01-28 LAB — URINALYSIS, ROUTINE W REFLEX MICROSCOPIC
Bilirubin Urine: NEGATIVE
GLUCOSE, UA: NEGATIVE mg/dL
Hgb urine dipstick: NEGATIVE
KETONES UR: NEGATIVE mg/dL
Leukocytes, UA: NEGATIVE
Nitrite: NEGATIVE
Protein, ur: NEGATIVE mg/dL
SPECIFIC GRAVITY, URINE: 1.025 (ref 1.005–1.030)
Urobilinogen, UA: 0.2 mg/dL (ref 0.0–1.0)
pH: 5.5 (ref 5.0–8.0)

## 2014-01-28 LAB — CBC WITH DIFFERENTIAL/PLATELET
BASOS ABS: 0 10*3/uL (ref 0.0–0.1)
Basophils Relative: 0 % (ref 0–1)
EOS PCT: 0 % (ref 0–5)
Eosinophils Absolute: 0 10*3/uL (ref 0.0–0.7)
HCT: 49.2 % (ref 39.0–52.0)
HEMOGLOBIN: 17.3 g/dL — AB (ref 13.0–17.0)
Lymphocytes Relative: 9 % — ABNORMAL LOW (ref 12–46)
Lymphs Abs: 0.8 10*3/uL (ref 0.7–4.0)
MCH: 30.4 pg (ref 26.0–34.0)
MCHC: 35.2 g/dL (ref 30.0–36.0)
MCV: 86.5 fL (ref 78.0–100.0)
MONO ABS: 0.5 10*3/uL (ref 0.1–1.0)
MONOS PCT: 6 % (ref 3–12)
Neutro Abs: 7.7 10*3/uL (ref 1.7–7.7)
Neutrophils Relative %: 85 % — ABNORMAL HIGH (ref 43–77)
Platelets: 213 10*3/uL (ref 150–400)
RBC: 5.69 MIL/uL (ref 4.22–5.81)
RDW: 13.4 % (ref 11.5–15.5)
WBC: 9 10*3/uL (ref 4.0–10.5)

## 2014-01-28 LAB — COMPREHENSIVE METABOLIC PANEL
ALT: 55 U/L — ABNORMAL HIGH (ref 0–53)
AST: 33 U/L (ref 0–37)
Albumin: 4.9 g/dL (ref 3.5–5.2)
Alkaline Phosphatase: 78 U/L (ref 39–117)
Anion gap: 11 (ref 5–15)
BILIRUBIN TOTAL: 1.4 mg/dL — AB (ref 0.3–1.2)
BUN: 12 mg/dL (ref 6–23)
CO2: 20 mmol/L (ref 19–32)
Calcium: 9.3 mg/dL (ref 8.4–10.5)
Chloride: 107 mEq/L (ref 96–112)
Creatinine, Ser: 1.22 mg/dL (ref 0.50–1.35)
GFR calc Af Amer: >90 mL/min (ref >90)
GFR calc non Af Amer: 84 mL/min — ABNORMAL LOW (ref >90)
Glucose, Bld: 107 mg/dL — ABNORMAL HIGH (ref 70–99)
POTASSIUM: 4.2 mmol/L (ref 3.5–5.1)
Sodium: 138 mmol/L (ref 135–145)
Total Protein: 8.1 g/dL (ref 6.0–8.3)

## 2014-01-28 LAB — LIPASE, BLOOD: LIPASE: 26 U/L (ref 11–59)

## 2014-01-28 MED ORDER — ONDANSETRON 4 MG PO TBDP: 4.0000 mg | ORAL_TABLET | Freq: Three times a day (TID) | ORAL | Status: DC | PRN

## 2014-01-28 MED ORDER — SODIUM CHLORIDE 0.9 % IV BOLUS (SEPSIS)
1000.0000 mL | Freq: Once | INTRAVENOUS | Status: AC
  Administered 2014-01-28: 1000 mL via INTRAVENOUS

## 2014-01-28 MED ORDER — ONDANSETRON HCL 4 MG/2ML IJ SOLN
4.0000 mg | Freq: Once | INTRAMUSCULAR | Status: AC
  Administered 2014-01-28: 4 mg via INTRAVENOUS
  Filled 2014-01-28: qty 2

## 2014-01-28 MED ORDER — FAMOTIDINE 20 MG PO TABS
20.0000 mg | ORAL_TABLET | Freq: Once | ORAL | Status: DC
  Filled 2014-01-28: qty 1

## 2014-01-28 NOTE — Discharge Instructions (Signed)
Please read and follow all provided instructions.  Your diagnoses today include:  1. Gastroenteritis     Tests performed today include:  Blood counts and electrolytes  Blood tests to check liver and kidney function  Blood tests to check pancreas function  Vital signs. See below for your results today.   Medications prescribed:   Zofran (ondansetron) - for nausea and vomiting  Take any prescribed medications only as directed.  Home care instructions:   Follow any educational materials contained in this packet.   Your abdominal pain, nausea, vomiting, and diarrhea may be caused by a viral gastroenteritis also called 'stomach flu'. You should rest for the next several days. Keep drinking plenty of fluids and use the medicine for nausea as directed.    Drink clear liquids for the next 24 hours and introduce solid foods slowly after 24 hours using the b.r.a.t. diet (Bananas, Rice, Applesauce, Toast, Yogurt).    Follow-up instructions: Please follow-up with your primary care provider in the next 2 days for further evaluation of your symptoms. If you are not feeling better in 48 hours you may have a condition that is more serious and you need re-evaluation.   Return instructions:  SEEK IMMEDIATE MEDICAL ATTENTION IF:  If you have pain that does not go away or becomes severe   A temperature above 101F develops   Repeated vomiting occurs (multiple episodes)   If you have pain that becomes localized to portions of the abdomen. The right side could possibly be appendicitis. In an adult, the left lower portion of the abdomen could be colitis or diverticulitis.   Blood is being passed in stools or vomit (bright red or black tarry stools)   You develop chest pain, difficulty breathing, dizziness or fainting, or become confused, poorly responsive, or inconsolable (young children)  If you have any other emergent concerns regarding your health  Additional Information: Abdominal  (belly) pain can be caused by many things. Your caregiver performed an examination and possibly ordered blood/urine tests and imaging (CT scan, x-rays, ultrasound). Many cases can be observed and treated at home after initial evaluation in the emergency department. Even though you are being discharged home, abdominal pain can be unpredictable. Therefore, you need a repeated exam if your pain does not resolve, returns, or worsens. Most patients with abdominal pain don't have to be admitted to the hospital or have surgery, but serious problems like appendicitis and gallbladder attacks can start out as nonspecific pain. Many abdominal conditions cannot be diagnosed in one visit, so follow-up evaluations are very important.  Your vital signs today were: BP 119/66 mmHg   Pulse 90   Temp(Src) 97.5 F (36.4 C) (Oral)   Resp 16   SpO2 98% If your blood pressure (bp) was elevated above 135/85 this visit, please have this repeated by your doctor within one month. --------------

## 2014-01-28 NOTE — ED Notes (Signed)
Pt states that he is unable to void at this time. Made aware of need for urine sample.

## 2014-01-28 NOTE — ED Notes (Signed)
Pt states last night he started having bad abd pain  Pt states he had a diarrhea bowel movement and later had some vomiting  Pt states he has vomited since then  Pt states the pain continues

## 2014-01-28 NOTE — ED Provider Notes (Signed)
CSN: [id number]     Arrival date & time 01/28/14  2536 History   None    Chief Complaint  Patient presents with  . Abdominal Pain  . Emesis  . Diarrhea     (Consider location/radiation/quality/duration/timing/severity/associated sxs/prior Treatment) HPI Comments: Patients with no significant past medical history presents with complaint of nausea, vomiting, diarrhea, generalized abdominal cramping starting approximately 10 PM. Patient had multiple episodes of vomiting and diarrhea overnight. No blood noted in vomit or stool. No urinary symptoms. No fever. Patient's son has been sick with diarrhea recently. The onset of this condition was acute. The course is constant. Aggravating factors: none. Alleviating factors: none. No treatment PTA.    Patient is a 19 y.o. male presenting with abdominal pain, vomiting, and diarrhea. The history is provided by the patient.  Abdominal Pain Associated symptoms: diarrhea, nausea and vomiting   Associated symptoms: no chest pain, no cough, no dysuria, no fever and no sore throat   Emesis Associated symptoms: abdominal pain and diarrhea   Associated symptoms: no headaches, no myalgias and no sore throat   Diarrhea Associated symptoms: abdominal pain and vomiting   Associated symptoms: no fever, no headaches and no myalgias     Past Medical History  Diagnosis Date  . Asthma    Past Surgical History  Procedure Laterality Date  . Rotator cuff repair    . Right ankle surgery      Family History  Problem Relation Age of Onset  . Diabetes Maternal Uncle    History  Substance Use Topics  . Smoking status: Current Every Day Smoker -- 0.50 packs/day for 5 years    Types: Cigarettes  . Smokeless tobacco: Not on file  . Alcohol Use: Yes     Comment: socially    Review of Systems  Constitutional: Negative for fever.  HENT: Negative for rhinorrhea and sore throat.   Eyes: Negative for redness.  Respiratory: Negative for cough.    Cardiovascular: Negative for chest pain.  Gastrointestinal: Positive for nausea, vomiting, abdominal pain and diarrhea. Negative for blood in stool.  Genitourinary: Negative for dysuria.  Musculoskeletal: Negative for myalgias.  Skin: Negative for rash.  Neurological: Negative for headaches.      Allergies  Review of patient's allergies indicates no known allergies.  Home Medications   Prior to Admission medications   Medication Sig Start Date End Date Taking? Authorizing Provider  albuterol (PROVENTIL HFA;VENTOLIN HFA) 108 (90 BASE) MCG/ACT inhaler Inhale 2 puffs into the lungs every 4 (four) hours as needed for wheezing or shortness of breath (cough). 12/10/13  Yes Charm Rings, MD  ibuprofen (ADVIL,MOTRIN) 800 MG tablet Take 1 tablet (800 mg total) by mouth every 8 (eight) hours as needed. Patient not taking: Reported on 01/28/2014 08/16/13   Doris Cheadle, MD  traMADol (ULTRAM) 50 MG tablet Take 1 tablet (50 mg total) by mouth every 6 (six) hours as needed (cough or pain). Patient not taking: Reported on 01/28/2014 12/10/13   Charm Rings, MD   BP 121/76 mmHg  Pulse 117  Temp(Src) 97.5 F (36.4 C) (Oral)  Resp 20  SpO2 96%   Physical Exam  Constitutional: He appears well-developed and well-nourished.  HENT:  Head: Normocephalic and atraumatic.  Mouth/Throat: Mucous membranes are dry.  Eyes: Conjunctivae are normal. Right eye exhibits no discharge. Left eye exhibits no discharge.  Neck: Normal range of motion. Neck supple.  Cardiovascular: Regular rhythm and normal heart sounds.  Tachycardia present.   Mild tachycardia  Pulmonary/Chest: Effort normal and breath sounds normal.  Abdominal: Soft. Bowel sounds are normal. He exhibits no distension. There is tenderness (generalized). There is no rebound and no guarding.  Neurological: He is alert.  Skin: Skin is warm and dry.  Psychiatric: He has a normal mood and affect.  Nursing note and vitals reviewed.   ED Course   Procedures (including critical care time) Labs Review Labs Reviewed  CBC WITH DIFFERENTIAL - Abnormal; Notable for the following:    Hemoglobin 17.3 (*)    Neutrophils Relative % 85 (*)    Lymphocytes Relative 9 (*)    All other components within normal limits  COMPREHENSIVE METABOLIC PANEL - Abnormal; Notable for the following:    Glucose, Bld 107 (*)    ALT 55 (*)    Total Bilirubin 1.4 (*)    GFR calc non Af Amer 84 (*)    All other components within normal limits  LIPASE, BLOOD  URINALYSIS, ROUTINE W REFLEX MICROSCOPIC    Imaging Review No results found.   EKG Interpretation None       6:57 AM Patient seen and examined. Work-up initiated. Medications ordered.   Vital signs reviewed and are as follows: BP 121/76 mmHg  Pulse 117  Temp(Src) 97.5 F (36.4 C) (Oral)  Resp 20  SpO2 96%  9:08 AM Patient doing well. Nausea resolved. He has tolerated a cup of ginger ale. On re-exam, abdomen is soft and non-tender.   Discussed clear liquid/brat diet.   The patient was urged to return to the Emergency Department immediately with worsening of current symptoms, worsening abdominal pain, persistent vomiting, blood noted in stools, fever, or any other concerns. The patient verbalized understanding.   Will d/c to home when fluids are completed.   MDM   Final diagnoses:  Gastroenteritis   Patient with symptoms consistent with viral gastroenteritis. Vitals are stable, no fever. No signs of dehydration, tolerating PO's. Lungs are clear. No focal abdominal pain, no concern for appendicitis, cholecystitis, pancreatitis, ruptured viscus, UTI, kidney stone, or any other abdominal etiology. Supportive therapy indicated with return if symptoms worsen. Patient counseled.       Renne CriglerJoshua Sharaine Delange, PA-C 01/28/14 [id number]  Flint MelterElliott L Wentz, MD 01/28/14 [phone]

## 2015-07-01 ENCOUNTER — Emergency Department (HOSPITAL_COMMUNITY): Payer: Self-pay

## 2015-07-01 ENCOUNTER — Emergency Department (HOSPITAL_COMMUNITY)
Admission: EM | Admit: 2015-07-01 | Discharge: 2015-07-01 | Disposition: A | Discharge status: Home / Self Care | Payer: Self-pay | Attending: Emergency Medicine | Admitting: Emergency Medicine

## 2015-07-01 ENCOUNTER — Encounter (HOSPITAL_COMMUNITY): Payer: Self-pay | Admitting: *Deleted

## 2015-07-01 DIAGNOSIS — W3400XA Accidental discharge from unspecified firearms or gun, initial encounter: Secondary | ICD-10-CM | POA: Insufficient documentation

## 2015-07-01 DIAGNOSIS — S21132A Puncture wound without foreign body of left front wall of thorax without penetration into thoracic cavity, initial encounter: Secondary | ICD-10-CM

## 2015-07-01 DIAGNOSIS — S41102A Unspecified open wound of left upper arm, initial encounter: Secondary | ICD-10-CM | POA: Insufficient documentation

## 2015-07-01 DIAGNOSIS — Y929 Unspecified place or not applicable: Secondary | ICD-10-CM | POA: Insufficient documentation

## 2015-07-01 DIAGNOSIS — R791 Abnormal coagulation profile: Secondary | ICD-10-CM | POA: Insufficient documentation

## 2015-07-01 DIAGNOSIS — S41132A Puncture wound without foreign body of left upper arm, initial encounter: Secondary | ICD-10-CM

## 2015-07-01 DIAGNOSIS — S20312A Abrasion of left front wall of thorax, initial encounter: Secondary | ICD-10-CM | POA: Insufficient documentation

## 2015-07-01 DIAGNOSIS — Y999 Unspecified external cause status: Secondary | ICD-10-CM | POA: Insufficient documentation

## 2015-07-01 DIAGNOSIS — J45909 Unspecified asthma, uncomplicated: Secondary | ICD-10-CM | POA: Insufficient documentation

## 2015-07-01 DIAGNOSIS — Y939 Activity, unspecified: Secondary | ICD-10-CM | POA: Insufficient documentation

## 2015-07-01 DIAGNOSIS — F17210 Nicotine dependence, cigarettes, uncomplicated: Secondary | ICD-10-CM | POA: Insufficient documentation

## 2015-07-01 HISTORY — DX: Unspecified asthma, uncomplicated: J45.909

## 2015-07-01 LAB — PREPARE FRESH FROZEN PLASMA
UNIT DIVISION: 0
Unit division: 0

## 2015-07-01 LAB — TYPE AND SCREEN
ABO/RH(D): B NEG
ANTIBODY SCREEN: NEGATIVE
Unit division: 0
Unit division: 0

## 2015-07-01 LAB — COMPREHENSIVE METABOLIC PANEL
ALBUMIN: 4.6 g/dL (ref 3.5–5.0)
ALT: 34 U/L (ref 17–63)
ANION GAP: 16 — AB (ref 5–15)
AST: 36 U/L (ref 15–41)
Alkaline Phosphatase: 68 U/L (ref 38–126)
BUN: 12 mg/dL (ref 6–20)
CHLORIDE: 103 mmol/L (ref 101–111)
CO2: 21 mmol/L — ABNORMAL LOW (ref 22–32)
Calcium: 9.7 mg/dL (ref 8.9–10.3)
Creatinine, Ser: 1.56 mg/dL — ABNORMAL HIGH (ref 0.61–1.24)
GFR calc Af Amer: >60 mL/min (ref >60)
GFR calc non Af Amer: >60 mL/min (ref >60)
GLUCOSE: 119 mg/dL — AB (ref 65–99)
POTASSIUM: 3.1 mmol/L — AB (ref 3.5–5.1)
Sodium: 140 mmol/L (ref 135–145)
Total Bilirubin: 0.9 mg/dL (ref 0.3–1.2)
Total Protein: 7.4 g/dL (ref 6.5–8.1)

## 2015-07-01 LAB — I-STAT CHEM 8, ED
BUN: 13 mg/dL (ref 6–20)
Calcium, Ion: 1.1 mmol/L — ABNORMAL LOW (ref 1.12–1.23)
Chloride: 104 mmol/L (ref 101–111)
Creatinine, Ser: 1.4 mg/dL — ABNORMAL HIGH (ref 0.61–1.24)
Glucose, Bld: 115 mg/dL — ABNORMAL HIGH (ref 65–99)
HEMATOCRIT: 49 % (ref 39.0–52.0)
HEMOGLOBIN: 16.7 g/dL (ref 13.0–17.0)
Potassium: 3.1 mmol/L — ABNORMAL LOW (ref 3.5–5.1)
SODIUM: 141 mmol/L (ref 135–145)
TCO2: 20 mmol/L (ref 0–100)

## 2015-07-01 LAB — CBC
HEMATOCRIT: 45.4 % (ref 39.0–52.0)
HEMOGLOBIN: 15.9 g/dL (ref 13.0–17.0)
MCH: 30.5 pg (ref 26.0–34.0)
MCHC: 35 g/dL (ref 30.0–36.0)
MCV: 87.1 fL (ref 78.0–100.0)
Platelets: 231 10*3/uL (ref 150–400)
RBC: 5.21 MIL/uL (ref 4.22–5.81)
RDW: 12.8 % (ref 11.5–15.5)
WBC: 11.7 10*3/uL — AB (ref 4.0–10.5)

## 2015-07-01 LAB — ETHANOL: Alcohol, Ethyl (B): <5 mg/dL (ref <5)

## 2015-07-01 LAB — I-STAT CG4 LACTIC ACID, ED
Lactic Acid, Venous: 8.08 mmol/L (ref 0.5–2.0)
Lactic Acid, Venous: 1.62 mmol/L (ref 0.5–2.0)

## 2015-07-01 LAB — PROTIME-INR
INR: 1.08 (ref 0.00–1.49)
Prothrombin Time: 14.2 seconds (ref 11.6–15.2)

## 2015-07-01 LAB — ABO/RH: ABO/RH(D): B NEG

## 2015-07-01 LAB — CDS SEROLOGY

## 2015-07-01 MED ORDER — HYDROMORPHONE HCL 1 MG/ML IJ SOLN
1.0000 mg | Freq: Once | INTRAMUSCULAR | Status: AC
  Administered 2015-07-01: 1 mg via INTRAVENOUS
  Filled 2015-07-01: qty 1

## 2015-07-01 MED ORDER — FENTANYL CITRATE (PF) 100 MCG/2ML IJ SOLN
100.0000 ug | INTRAMUSCULAR | Status: DC | PRN
  Filled 2015-07-01: qty 2

## 2015-07-01 MED ORDER — TETANUS-DIPHTH-ACELL PERTUSSIS 5-2.5-18.5 LF-MCG/0.5 IM SUSP
INTRAMUSCULAR | Status: AC
  Administered 2015-07-01: 0.5 mL via INTRAMUSCULAR
  Filled 2015-07-01: qty 0.5

## 2015-07-01 MED ORDER — SODIUM CHLORIDE 0.9 % IV SOLN
Freq: Once | INTRAVENOUS | Status: AC
  Administered 2015-07-01: 125 mL/h via INTRAVENOUS

## 2015-07-01 MED ORDER — TETANUS-DIPHTH-ACELL PERTUSSIS 5-2.5-18.5 LF-MCG/0.5 IM SUSP
0.5000 mL | Freq: Once | INTRAMUSCULAR | Status: AC
  Administered 2015-07-01: 0.5 mL via INTRAMUSCULAR

## 2015-07-01 MED ORDER — FENTANYL CITRATE (PF) 100 MCG/2ML IJ SOLN
INTRAMUSCULAR | Status: AC | PRN
  Administered 2015-07-01 (×2): 100 ug via INTRAVENOUS

## 2015-07-01 MED ORDER — SODIUM CHLORIDE 0.9 % IV BOLUS (SEPSIS)
1000.0000 mL | Freq: Once | INTRAVENOUS | Status: AC
  Administered 2015-07-01: 1000 mL via INTRAVENOUS

## 2015-07-01 MED ORDER — OXYCODONE-ACETAMINOPHEN 5-325 MG PO TABS
1.0000 | ORAL_TABLET | ORAL | Status: DC | PRN
Start: 2015-07-01 — End: 2015-07-08
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
## 2015-07-01 NOTE — Progress Notes (Signed)
CSW met with pt at the bedside and provided emotional support.  Pt asking to call his son, but [employer] will not allow him to take calls at this time.  CSW available to assist pt with making phone calls, as appropriate.
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
## 2015-07-01 NOTE — Discharge Instructions (Signed)
You can remove your left arm and left foot dressing and get your wounds wet in 2 days. Expect bloody drainage. Place Neosporin ointment or Vasaline over your wounds and new dry dressings or large band-aides daily after each shower.
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
## 2015-07-03 ENCOUNTER — Encounter (HOSPITAL_COMMUNITY): Payer: Self-pay | Admitting: Emergency Medicine

## 2015-07-08 ENCOUNTER — Encounter (HOSPITAL_COMMUNITY): Payer: Self-pay | Admitting: *Deleted

## 2015-07-08 ENCOUNTER — Ambulatory Visit (HOSPITAL_COMMUNITY)
Admission: EM | Admit: 2015-07-08 | Discharge: 2015-07-08 | Disposition: A | Discharge status: Home / Self Care | Payer: Self-pay | Attending: Emergency Medicine | Admitting: Emergency Medicine

## 2015-07-08 DIAGNOSIS — S91302A Unspecified open wound, left foot, initial encounter: Secondary | ICD-10-CM

## 2015-07-08 MED ORDER — HYDROCODONE-ACETAMINOPHEN 5-325 MG PO TABS: 1.0000 | ORAL_TABLET | Freq: Four times a day (QID) | ORAL | Status: DC | PRN

## 2015-07-08 MED ORDER — LIDOCAINE-EPINEPHRINE-TETRACAINE (LET) SOLUTION
3.0000 mL | Freq: Once | NASAL | Status: AC
Start: 2015-07-08 — End: 2015-07-08
  Administered 2015-07-08: 3 mL via TOPICAL

## 2015-07-08 MED ORDER — LIDOCAINE-EPINEPHRINE-TETRACAINE (LET) SOLUTION
NASAL | Status: AC
  Filled 2015-07-08: qty 3

## 2015-07-08 NOTE — Discharge Instructions (Signed)
The wound looks very healthy. There is already new skin starting to grow in. You do not need to use any special cleansers or hydrogen peroxide anymore. Wash it with soap and water twice a day. Apply Vaseline and cover with a bandage. This will likely take another 1-2 weeks to heal over. The more you can stay off of the foot, the faster it will heal. If you see purulent drainage, increasing redness or swelling, please come back.
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
## 2015-07-15 ENCOUNTER — Encounter (HOSPITAL_COMMUNITY): Payer: Self-pay | Admitting: Family Medicine

## 2015-07-15 ENCOUNTER — Ambulatory Visit (HOSPITAL_COMMUNITY)
Admission: EM | Admit: 2015-07-15 | Discharge: 2015-07-15 | Disposition: A | Discharge status: Home / Self Care | Payer: Medicaid Other | Attending: Radiology | Admitting: Radiology

## 2015-07-15 DIAGNOSIS — W3400XD Accidental discharge from unspecified firearms or gun, subsequent encounter: Secondary | ICD-10-CM

## 2015-07-15 DIAGNOSIS — Z4802 Encounter for removal of sutures: Secondary | ICD-10-CM

## 2015-07-15 MED ORDER — SULFAMETHOXAZOLE-TRIMETHOPRIM 800-160 MG PO TABS: 1.0000 | ORAL_TABLET | Freq: Two times a day (BID) | ORAL | Status: AC

## 2015-07-15 MED ORDER — SULFAMETHOXAZOLE-TRIMETHOPRIM 800-160 MG PO TABS: 1.0000 | ORAL_TABLET | Freq: Two times a day (BID) | ORAL | Status: DC

## 2015-07-15 MED ORDER — TRAMADOL HCL 50 MG PO TABS: 50.0000 mg | ORAL_TABLET | Freq: Four times a day (QID) | ORAL | Status: DC | PRN
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
## 2015-07-15 NOTE — ED Provider Notes (Signed)
CSN: [id number]     Arrival date & time 07/15/15  1159 History   None    No chief complaint on file.  (Consider location/radiation/quality/duration/timing/severity/associated sxs/prior Treatment) The history is provided by the patient.    Past Medical History  Diagnosis Date  . Asthma   . Asthma    Past Surgical History  Procedure Laterality Date  . Rotator cuff repair    . Right ankle surgery     . Ankle surgery Right   . Skull surgery     Family History  Problem Relation Age of Onset  . Diabetes Maternal Uncle    Social History  Substance Use Topics  . Smoking status: Current Every Day Smoker -- 0.50 packs/day for 5 years    Types: Cigarettes  . Smokeless tobacco: Not on file  . Alcohol Use: Yes     Comment: socially    Review of Systems  Constitutional: Negative.   Respiratory: Negative.   Skin: Positive for wound (Patient has 3 individual wound to left flank. Patient denies any fevers  Patient states that  he has 1 episode of green thick drainage this morningd).    Allergies  Review of patient's allergies indicates no known allergies.  Home Medications   Prior to Admission medications   Medication Sig Start Date End Date Taking? Authorizing Provider  albuterol (PROVENTIL HFA;VENTOLIN HFA) 108 (90 BASE) MCG/ACT inhaler Inhale 2 puffs into the lungs every 4 (four) hours as needed for wheezing or shortness of breath (cough). 12/10/13   Charm Rings, MD  HYDROcodone-acetaminophen (NORCO) 5-325 MG tablet Take 1 tablet by mouth every 6 (six) hours as needed for moderate pain. 07/08/15   Charm Rings, MD   Meds Ordered and Administered this Visit  Medications - No data to display  There were no vitals taken for this visit. No data found.   Physical Exam  Constitutional: He appears well-developed and well-nourished.  Cardiovascular: Normal rate and regular rhythm.   Pulmonary/Chest: Effort normal and breath sounds normal.  Skin:  Patient has 3 individual wounds  to left flank with the largest being approximately 5 cm x 3 center meters. Granulation tissues noted with no drainage. Patient has 4 staples to left elbow. No erythema or drainage noted to area.     ED Course  .Suture Removal Date/Time: 07/15/2015 12:36 PM Performed by: Alene Mires Authorized by: Anders Grant C Consent: Verbal consent obtained. Risks and benefits: risks, benefits and alternatives were discussed Consent given by: patient Patient understanding: patient states understanding of the procedure being performed Patient consent: the patient's understanding of the procedure matches consent given Procedure consent: procedure consent matches procedure scheduled Relevant documents: relevant documents present and verified Test results: test results available and properly labeled Site marked: the operative site was marked Required items: required blood products, implants, devices, and special equipment available Patient identity confirmed: verbally with patient Time out: Immediately prior to procedure a "time out" was called to verify the correct patient, procedure, equipment, support staff and site/side marked as required.   (including critical care time)  Labs Review Labs Reviewed - No data to display  Imaging Review No results found.   Visual Acuity Review  Right Eye Distance:   Left Eye Distance:   Bilateral Distance:    Right Eye Near:   Left Eye Near:    Bilateral Near:         MDM  No diagnosis found. 7 staples removed from left elbow. Patient instructed to  apply triple antibiotic cream to site and change dressings daily. Patient will be give antibiotics and pain medication for  evulsion on back     Alene MiresJennifer C Hurshel Bouillon, NP 07/15/15 1244  Alene MiresJennifer C Ashtan Laton, NP 07/15/15 1309

## 2015-07-15 NOTE — Discharge Instructions (Signed)
Please follow up with orthopedics as directed.

## 2015-07-19 ENCOUNTER — Emergency Department (HOSPITAL_COMMUNITY)
Admission: EM | Admit: 2015-07-19 | Discharge: 2015-07-19 | Disposition: A | Discharge status: Home / Self Care | Payer: Medicaid Other | Attending: Emergency Medicine | Admitting: Emergency Medicine

## 2015-07-19 ENCOUNTER — Encounter (HOSPITAL_COMMUNITY): Payer: Self-pay | Admitting: Nurse Practitioner

## 2015-07-19 DIAGNOSIS — J45909 Unspecified asthma, uncomplicated: Secondary | ICD-10-CM | POA: Insufficient documentation

## 2015-07-19 DIAGNOSIS — T792XXA Traumatic secondary and recurrent hemorrhage and seroma, initial encounter: Secondary | ICD-10-CM

## 2015-07-19 DIAGNOSIS — F17210 Nicotine dependence, cigarettes, uncomplicated: Secondary | ICD-10-CM | POA: Insufficient documentation

## 2015-07-19 HISTORY — DX: Accidental discharge from unspecified firearms or gun, initial encounter: W34.00XA

## 2015-07-19 HISTORY — DX: Unspecified firearm discharge, undetermined intent, initial encounter: Y24.9XXA

## 2015-07-19 LAB — CBC WITH DIFFERENTIAL/PLATELET
BASOS ABS: 0 10*3/uL (ref 0.0–0.1)
Basophils Relative: 0 %
EOS PCT: 1 %
Eosinophils Absolute: 0 10*3/uL (ref 0.0–0.7)
HCT: 39.8 % (ref 39.0–52.0)
HEMOGLOBIN: 13.9 g/dL (ref 13.0–17.0)
LYMPHS PCT: 31 %
Lymphs Abs: 2 10*3/uL (ref 0.7–4.0)
MCH: 30.5 pg (ref 26.0–34.0)
MCHC: 34.9 g/dL (ref 30.0–36.0)
MCV: 87.5 fL (ref 78.0–100.0)
Monocytes Absolute: 0.5 10*3/uL (ref 0.1–1.0)
Monocytes Relative: 7 %
NEUTROS ABS: 3.8 10*3/uL (ref 1.7–7.7)
Neutrophils Relative %: 61 %
PLATELETS: 274 10*3/uL (ref 150–400)
RBC: 4.55 MIL/uL (ref 4.22–5.81)
RDW: 12.8 % (ref 11.5–15.5)
WBC: 6.3 10*3/uL (ref 4.0–10.5)

## 2015-07-19 LAB — BASIC METABOLIC PANEL
ANION GAP: 6 (ref 5–15)
BUN: 9 mg/dL (ref 6–20)
CHLORIDE: 107 mmol/L (ref 101–111)
CO2: 24 mmol/L (ref 22–32)
Calcium: 8.9 mg/dL (ref 8.9–10.3)
Creatinine, Ser: 1.38 mg/dL — ABNORMAL HIGH (ref 0.61–1.24)
GFR calc Af Amer: >60 mL/min (ref >60)
GFR calc non Af Amer: >60 mL/min (ref >60)
Glucose, Bld: 93 mg/dL (ref 65–99)
POTASSIUM: 4.3 mmol/L (ref 3.5–5.1)
SODIUM: 137 mmol/L (ref 135–145)

## 2015-07-19 MED ORDER — SULFAMETHOXAZOLE-TRIMETHOPRIM 800-160 MG PO TABS: 1.0000 | ORAL_TABLET | Freq: Two times a day (BID) | ORAL | Status: AC

## 2015-07-19 MED ORDER — LIDOCAINE-EPINEPHRINE (PF) 2 %-1:200000 IJ SOLN
20.0000 mL | Freq: Once | INTRAMUSCULAR | Status: AC
  Administered 2015-07-19: 20 mL via INTRADERMAL
  Filled 2015-07-19: qty 20

## 2015-07-19 MED ORDER — HYDROCODONE-ACETAMINOPHEN 5-325 MG PO TABS
1.0000 | ORAL_TABLET | Freq: Once | ORAL | Status: AC
  Administered 2015-07-19: 1 via ORAL
  Filled 2015-07-19: qty 1
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
## 2015-07-19 NOTE — ED Notes (Signed)
D/C instructions reviewed with patient and he verbalizes understanding. ABX continuation discussed and pain management. Patient able to walk out of ED and going home by himself

## 2015-07-19 NOTE — ED Notes (Signed)
After finishing triage, pt states " i would also like to get tested for all diseases while im here, including stds."
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
## 2015-07-19 NOTE — ED Notes (Signed)
Pt. Wishes to be contacted at this number for test results [phone]

## 2015-07-20 LAB — GC/CHLAMYDIA PROBE AMP (~~LOC~~) NOT AT ARMC
Chlamydia: NEGATIVE
Neisseria Gonorrhea: NEGATIVE

## 2015-07-20 LAB — HIV ANTIBODY (ROUTINE TESTING W REFLEX): HIV SCREEN 4TH GENERATION: NONREACTIVE

## 2015-07-29 ENCOUNTER — Encounter (HOSPITAL_COMMUNITY): Payer: Self-pay

## 2015-07-29 ENCOUNTER — Emergency Department (HOSPITAL_COMMUNITY)
Admission: EM | Admit: 2015-07-29 | Discharge: 2015-07-29 | Disposition: A | Discharge status: Home / Self Care | Payer: Medicaid Other | Attending: Emergency Medicine | Admitting: Emergency Medicine

## 2015-07-29 DIAGNOSIS — Z79899 Other long term (current) drug therapy: Secondary | ICD-10-CM | POA: Insufficient documentation

## 2015-07-29 DIAGNOSIS — F17210 Nicotine dependence, cigarettes, uncomplicated: Secondary | ICD-10-CM | POA: Insufficient documentation

## 2015-07-29 DIAGNOSIS — J45909 Unspecified asthma, uncomplicated: Secondary | ICD-10-CM | POA: Insufficient documentation

## 2015-07-29 DIAGNOSIS — T792XXA Traumatic secondary and recurrent hemorrhage and seroma, initial encounter: Secondary | ICD-10-CM | POA: Insufficient documentation

## 2015-07-29 LAB — CBC
HEMATOCRIT: 41 % (ref 39.0–52.0)
HEMOGLOBIN: 14.3 g/dL (ref 13.0–17.0)
MCH: 30.6 pg (ref 26.0–34.0)
MCHC: 34.9 g/dL (ref 30.0–36.0)
MCV: 87.6 fL (ref 78.0–100.0)
Platelets: 237 10*3/uL (ref 150–400)
RBC: 4.68 MIL/uL (ref 4.22–5.81)
RDW: 12.8 % (ref 11.5–15.5)
WBC: 4.3 10*3/uL (ref 4.0–10.5)

## 2015-07-29 MED ORDER — ACETAMINOPHEN 325 MG PO TABS
650.0000 mg | ORAL_TABLET | Freq: Once | ORAL | Status: AC
  Administered 2015-07-29: 650 mg via ORAL
  Filled 2015-07-29: qty 2

## 2015-07-29 MED ORDER — IBUPROFEN 400 MG PO TABS
400.0000 mg | ORAL_TABLET | Freq: Once | ORAL | Status: AC
  Administered 2015-07-29: 400 mg via ORAL
  Filled 2015-07-29: qty 1
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
## 2015-07-29 NOTE — ED Notes (Signed)
PA at bedside.
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
## 2015-10-01 ENCOUNTER — Ambulatory Visit (HOSPITAL_COMMUNITY)
Admission: EM | Admit: 2015-10-01 | Discharge: 2015-10-01 | Disposition: A | Discharge status: Home / Self Care | Payer: Medicaid Other | Attending: Internal Medicine | Admitting: Internal Medicine

## 2015-10-01 ENCOUNTER — Encounter (HOSPITAL_COMMUNITY): Payer: Self-pay | Admitting: Emergency Medicine

## 2015-10-01 DIAGNOSIS — Z202 Contact with and (suspected) exposure to infections with a predominantly sexual mode of transmission: Secondary | ICD-10-CM

## 2015-10-01 DIAGNOSIS — R369 Urethral discharge, unspecified: Secondary | ICD-10-CM | POA: Insufficient documentation

## 2015-10-01 DIAGNOSIS — Z114 Encounter for screening for human immunodeficiency virus [HIV]: Secondary | ICD-10-CM | POA: Insufficient documentation

## 2015-10-01 DIAGNOSIS — F17210 Nicotine dependence, cigarettes, uncomplicated: Secondary | ICD-10-CM | POA: Insufficient documentation

## 2015-10-01 DIAGNOSIS — Z79899 Other long term (current) drug therapy: Secondary | ICD-10-CM | POA: Insufficient documentation

## 2015-10-01 DIAGNOSIS — N342 Other urethritis: Secondary | ICD-10-CM | POA: Insufficient documentation

## 2015-10-01 LAB — POCT URINALYSIS DIP (DEVICE)
BILIRUBIN URINE: NEGATIVE
Glucose, UA: NEGATIVE mg/dL
HGB URINE DIPSTICK: NEGATIVE
KETONES UR: NEGATIVE mg/dL
Leukocytes, UA: NEGATIVE
Nitrite: NEGATIVE
PH: 6 (ref 5.0–8.0)
Protein, ur: NEGATIVE mg/dL
Specific Gravity, Urine: 1.025 (ref 1.005–1.030)
Urobilinogen, UA: 1 mg/dL (ref 0.0–1.0)

## 2015-10-01 LAB — RAPID HIV SCREEN (HIV 1/2 AB+AG)
HIV 1/2 Antibodies: NONREACTIVE
HIV-1 P24 Antigen - HIV24: NONREACTIVE

## 2015-10-01 MED ORDER — CEFTRIAXONE SODIUM 250 MG IJ SOLR
250.0000 mg | Freq: Once | INTRAMUSCULAR | Status: AC
  Administered 2015-10-01: 250 mg via INTRAMUSCULAR

## 2015-10-01 MED ORDER — AZITHROMYCIN 250 MG PO TABS
ORAL_TABLET | ORAL | Status: AC
  Filled 2015-10-01: qty 4

## 2015-10-01 MED ORDER — CEFTRIAXONE SODIUM 250 MG IJ SOLR
INTRAMUSCULAR | Status: AC
  Filled 2015-10-01: qty 250

## 2015-10-01 MED ORDER — LIDOCAINE HCL (PF) 1 % IJ SOLN
INTRAMUSCULAR | Status: AC
  Filled 2015-10-01: qty 2

## 2015-10-01 MED ORDER — AZITHROMYCIN 250 MG PO TABS
1000.0000 mg | ORAL_TABLET | Freq: Once | ORAL | Status: AC
  Administered 2015-10-01: 1000 mg via ORAL

## 2015-10-01 NOTE — ED Triage Notes (Signed)
[facility] a [facility] of dysuria and penile discharge that started today.
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
## 2015-10-02 LAB — URINE CYTOLOGY ANCILLARY ONLY
Chlamydia: NEGATIVE
Neisseria Gonorrhea: NEGATIVE
TRICH (WINDOWPATH): NEGATIVE

## 2015-10-02 LAB — RPR: RPR Ser Ql: NONREACTIVE

## 2015-11-26 ENCOUNTER — Emergency Department (HOSPITAL_COMMUNITY)
Admission: EM | Admit: 2015-11-26 | Discharge: 2015-11-26 | Disposition: A | Discharge status: Home / Self Care | Payer: Self-pay | Attending: Emergency Medicine | Admitting: Emergency Medicine

## 2015-11-26 ENCOUNTER — Emergency Department (HOSPITAL_COMMUNITY): Payer: Self-pay

## 2015-11-26 ENCOUNTER — Encounter (HOSPITAL_COMMUNITY): Payer: Self-pay

## 2015-11-26 DIAGNOSIS — R300 Dysuria: Secondary | ICD-10-CM | POA: Insufficient documentation

## 2015-11-26 DIAGNOSIS — Y929 Unspecified place or not applicable: Secondary | ICD-10-CM | POA: Insufficient documentation

## 2015-11-26 DIAGNOSIS — S62644A Nondisplaced fracture of proximal phalanx of right ring finger, initial encounter for closed fracture: Secondary | ICD-10-CM | POA: Insufficient documentation

## 2015-11-26 DIAGNOSIS — F17210 Nicotine dependence, cigarettes, uncomplicated: Secondary | ICD-10-CM | POA: Insufficient documentation

## 2015-11-26 DIAGNOSIS — Y999 Unspecified external cause status: Secondary | ICD-10-CM | POA: Insufficient documentation

## 2015-11-26 DIAGNOSIS — Y9389 Activity, other specified: Secondary | ICD-10-CM | POA: Insufficient documentation

## 2015-11-26 DIAGNOSIS — Z202 Contact with and (suspected) exposure to infections with a predominantly sexual mode of transmission: Secondary | ICD-10-CM | POA: Insufficient documentation

## 2015-11-26 DIAGNOSIS — J45909 Unspecified asthma, uncomplicated: Secondary | ICD-10-CM | POA: Insufficient documentation

## 2015-11-26 MED ORDER — STERILE WATER FOR INJECTION IJ SOLN
INTRAMUSCULAR | Status: AC
  Administered 2015-11-26: 10 mL
  Filled 2015-11-26: qty 10

## 2015-11-26 MED ORDER — AZITHROMYCIN 250 MG PO TABS
1000.0000 mg | ORAL_TABLET | Freq: Once | ORAL | Status: AC
  Administered 2015-11-26: 1000 mg via ORAL
  Filled 2015-11-26: qty 4

## 2015-11-26 MED ORDER — METRONIDAZOLE 500 MG PO TABS
2000.0000 mg | ORAL_TABLET | Freq: Once | ORAL | Status: AC
  Administered 2015-11-26: 2000 mg via ORAL
  Filled 2015-11-26: qty 4

## 2015-11-26 MED ORDER — ONDANSETRON 4 MG PO TBDP
8.0000 mg | ORAL_TABLET | Freq: Once | ORAL | Status: AC
  Administered 2015-11-26: 8 mg via ORAL
  Filled 2015-11-26: qty 2

## 2015-11-26 MED ORDER — IBUPROFEN 800 MG PO TABS: 800.0000 mg | ORAL_TABLET | Freq: Three times a day (TID) | ORAL | 0 refills | Status: DC | PRN

## 2015-11-26 MED ORDER — IBUPROFEN 400 MG PO TABS
600.0000 mg | ORAL_TABLET | Freq: Once | ORAL | Status: AC
  Administered 2015-11-26: 600 mg via ORAL
  Filled 2015-11-26: qty 1

## 2015-11-26 MED ORDER — CEFTRIAXONE SODIUM 250 MG IJ SOLR
250.0000 mg | Freq: Once | INTRAMUSCULAR | Status: AC
  Administered 2015-11-26: 250 mg via INTRAMUSCULAR
  Filled 2015-11-26: qty 250

## 2015-11-26 NOTE — ED Notes (Signed)
Declined W/C at D/C and was escorted to lobby by RN. 
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
## 2015-11-26 NOTE — Discharge Instructions (Signed)
Read the information below.  Use the prescribed medication as directed.  Please discuss all new medications with your pharmacist.  You may return to the Emergency Department at any time for worsening condition or any new symptoms that concern you.    If you develop uncontrolled pain, weakness or numbness of the extremity, severe discoloration of the skin, or you are unable to move your finger, return to the ER for a recheck.    °
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
## 2015-11-27 LAB — GC/CHLAMYDIA PROBE AMP (~~LOC~~) NOT AT ARMC
Chlamydia: NEGATIVE
NEISSERIA GONORRHEA: NEGATIVE

## 2015-11-27 LAB — HIV ANTIBODY (ROUTINE TESTING W REFLEX): HIV SCREEN 4TH GENERATION: NONREACTIVE

## 2015-11-27 LAB — RPR: RPR: NONREACTIVE

## 2016-07-03 ENCOUNTER — Encounter (HOSPITAL_COMMUNITY): Payer: Self-pay | Admitting: Emergency Medicine

## 2016-07-03 ENCOUNTER — Ambulatory Visit (HOSPITAL_COMMUNITY)
Admission: EM | Admit: 2016-07-03 | Discharge: 2016-07-03 | Disposition: A | Discharge status: Home / Self Care | Payer: Medicaid Other | Attending: Family Medicine | Admitting: Family Medicine

## 2016-07-03 DIAGNOSIS — Z202 Contact with and (suspected) exposure to infections with a predominantly sexual mode of transmission: Secondary | ICD-10-CM | POA: Diagnosis not present

## 2016-07-03 DIAGNOSIS — Z113 Encounter for screening for infections with a predominantly sexual mode of transmission: Secondary | ICD-10-CM | POA: Diagnosis not present

## 2016-07-03 DIAGNOSIS — R300 Dysuria: Secondary | ICD-10-CM | POA: Diagnosis not present

## 2016-07-03 MED ORDER — CEFTRIAXONE SODIUM 250 MG IJ SOLR
INTRAMUSCULAR | Status: AC
  Filled 2016-07-03: qty 250

## 2016-07-03 MED ORDER — AZITHROMYCIN 250 MG PO TABS
ORAL_TABLET | ORAL | Status: AC
  Filled 2016-07-03: qty 4

## 2016-07-03 MED ORDER — PHENAZOPYRIDINE HCL 200 MG PO TABS: 200.0000 mg | ORAL_TABLET | Freq: Three times a day (TID) | ORAL | 0 refills | Status: DC

## 2016-07-03 MED ORDER — CEFTRIAXONE SODIUM 250 MG IJ SOLR
250.0000 mg | Freq: Once | INTRAMUSCULAR | Status: AC
  Administered 2016-07-03: 250 mg via INTRAMUSCULAR

## 2016-07-03 MED ORDER — AZITHROMYCIN 250 MG PO TABS
1000.0000 mg | ORAL_TABLET | Freq: Once | ORAL | Status: AC
  Administered 2016-07-03: 1000 mg via ORAL

## 2016-07-03 NOTE — ED Notes (Signed)
Dirty and clean urine collected. 
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
## 2016-07-04 LAB — URINE CYTOLOGY ANCILLARY ONLY
Chlamydia: NEGATIVE
Neisseria Gonorrhea: NEGATIVE
Trichomonas: NEGATIVE

## 2016-08-17 ENCOUNTER — Encounter (HOSPITAL_COMMUNITY): Payer: Self-pay | Admitting: Emergency Medicine

## 2016-08-17 ENCOUNTER — Emergency Department (HOSPITAL_COMMUNITY)
Admission: EM | Admit: 2016-08-17 | Discharge: 2016-08-17 | Disposition: A | Discharge status: Home / Self Care | Payer: Medicaid Other | Attending: Emergency Medicine | Admitting: Emergency Medicine

## 2016-08-17 DIAGNOSIS — J45909 Unspecified asthma, uncomplicated: Secondary | ICD-10-CM | POA: Insufficient documentation

## 2016-08-17 DIAGNOSIS — N489 Disorder of penis, unspecified: Secondary | ICD-10-CM | POA: Insufficient documentation

## 2016-08-17 DIAGNOSIS — N4889 Other specified disorders of penis: Secondary | ICD-10-CM

## 2016-08-17 DIAGNOSIS — F17210 Nicotine dependence, cigarettes, uncomplicated: Secondary | ICD-10-CM | POA: Insufficient documentation

## 2016-08-17 MED ORDER — DOXYCYCLINE HYCLATE 100 MG PO CAPS: 100.0000 mg | ORAL_CAPSULE | Freq: Two times a day (BID) | ORAL | 0 refills | Status: DC

## 2016-08-17 NOTE — ED Notes (Signed)
See triage assessment
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
## 2016-08-17 NOTE — ED Notes (Signed)
Pt requesting to speak with PA
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
## 2016-08-19 LAB — GC/CHLAMYDIA PROBE AMP (~~LOC~~) NOT AT ARMC
Chlamydia: NEGATIVE
Neisseria Gonorrhea: NEGATIVE

## 2017-03-25 ENCOUNTER — Encounter (HOSPITAL_COMMUNITY): Payer: Self-pay | Admitting: Emergency Medicine

## 2017-03-25 ENCOUNTER — Ambulatory Visit (HOSPITAL_COMMUNITY)
Admission: EM | Admit: 2017-03-25 | Discharge: 2017-03-25 | Disposition: A | Discharge status: Home / Self Care | Payer: Self-pay | Attending: Family Medicine | Admitting: Family Medicine

## 2017-03-25 DIAGNOSIS — Z711 Person with feared health complaint in whom no diagnosis is made: Secondary | ICD-10-CM

## 2017-03-25 DIAGNOSIS — Z202 Contact with and (suspected) exposure to infections with a predominantly sexual mode of transmission: Secondary | ICD-10-CM

## 2017-03-25 DIAGNOSIS — Z113 Encounter for screening for infections with a predominantly sexual mode of transmission: Secondary | ICD-10-CM

## 2017-03-25 DIAGNOSIS — F17210 Nicotine dependence, cigarettes, uncomplicated: Secondary | ICD-10-CM | POA: Insufficient documentation

## 2017-03-25 DIAGNOSIS — R300 Dysuria: Secondary | ICD-10-CM

## 2017-03-25 MED ORDER — CEFTRIAXONE SODIUM 250 MG IJ SOLR
INTRAMUSCULAR | Status: AC
  Filled 2017-03-25: qty 250

## 2017-03-25 MED ORDER — CEFTRIAXONE SODIUM 250 MG IJ SOLR
250.0000 mg | Freq: Once | INTRAMUSCULAR | Status: AC
  Administered 2017-03-25: 250 mg via INTRAMUSCULAR

## 2017-03-25 MED ORDER — AZITHROMYCIN 250 MG PO TABS
1000.0000 mg | ORAL_TABLET | Freq: Once | ORAL | Status: AC
  Administered 2017-03-25: 1000 mg via ORAL

## 2017-03-25 MED ORDER — AZITHROMYCIN 250 MG PO TABS
ORAL_TABLET | ORAL | Status: AC
  Filled 2017-03-25: qty 4
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
## 2017-03-25 NOTE — Discharge Instructions (Signed)
We have treated you today for gonorrhea and chlamydia.  °Will notify you of any positive findings and if any changes to treatment are needed.   °Please withhold from intercourse for the next week. °Please use condoms to prevent STD's.   °
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
## 2017-03-26 LAB — RPR: RPR: NONREACTIVE

## 2017-03-26 LAB — URINE CYTOLOGY ANCILLARY ONLY
CHLAMYDIA, DNA PROBE: NEGATIVE
NEISSERIA GONORRHEA: NEGATIVE
Trichomonas: NEGATIVE

## 2017-03-26 LAB — HIV ANTIBODY (ROUTINE TESTING W REFLEX): HIV Screen 4th Generation wRfx: NONREACTIVE

## 2017-08-03 ENCOUNTER — Ambulatory Visit (HOSPITAL_COMMUNITY)
Admission: EM | Admit: 2017-08-03 | Discharge: 2017-08-03 | Disposition: A | Discharge status: Home / Self Care | Payer: Self-pay | Attending: Internal Medicine | Admitting: Internal Medicine

## 2017-08-03 ENCOUNTER — Encounter (HOSPITAL_COMMUNITY): Payer: Self-pay | Admitting: Emergency Medicine

## 2017-08-03 DIAGNOSIS — F17210 Nicotine dependence, cigarettes, uncomplicated: Secondary | ICD-10-CM | POA: Insufficient documentation

## 2017-08-03 DIAGNOSIS — Z202 Contact with and (suspected) exposure to infections with a predominantly sexual mode of transmission: Secondary | ICD-10-CM

## 2017-08-03 DIAGNOSIS — R300 Dysuria: Secondary | ICD-10-CM | POA: Insufficient documentation

## 2017-08-03 DIAGNOSIS — Z113 Encounter for screening for infections with a predominantly sexual mode of transmission: Secondary | ICD-10-CM | POA: Insufficient documentation

## 2017-08-03 LAB — POCT URINALYSIS DIP (DEVICE)
Bilirubin Urine: NEGATIVE
GLUCOSE, UA: NEGATIVE mg/dL
Hgb urine dipstick: NEGATIVE
KETONES UR: NEGATIVE mg/dL
LEUKOCYTES UA: NEGATIVE
Nitrite: NEGATIVE
Protein, ur: NEGATIVE mg/dL
UROBILINOGEN UA: 0.2 mg/dL (ref 0.0–1.0)
pH: 6 (ref 5.0–8.0)

## 2017-08-03 MED ORDER — AZITHROMYCIN 250 MG PO TABS
ORAL_TABLET | ORAL | Status: AC
  Filled 2017-08-03: qty 4

## 2017-08-03 MED ORDER — CEFTRIAXONE SODIUM 250 MG IJ SOLR
250.0000 mg | Freq: Once | INTRAMUSCULAR | Status: AC
  Administered 2017-08-03: 250 mg via INTRAMUSCULAR

## 2017-08-03 MED ORDER — LIDOCAINE HCL (PF) 1 % IJ SOLN
INTRAMUSCULAR | Status: AC
  Filled 2017-08-03: qty 2

## 2017-08-03 MED ORDER — AZITHROMYCIN 250 MG PO TABS
1000.0000 mg | ORAL_TABLET | Freq: Once | ORAL | Status: AC
  Administered 2017-08-03: 1000 mg via ORAL

## 2017-08-03 MED ORDER — CEFTRIAXONE SODIUM 250 MG IJ SOLR
INTRAMUSCULAR | Status: AC
  Filled 2017-08-03: qty 250
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
## 2017-08-03 NOTE — ED Triage Notes (Signed)
Pt states 'when I pee, afterwards I feel kinda weird". Wants to be tested for STDs.

## 2017-08-03 NOTE — Discharge Instructions (Signed)
We have treated you today for gonorrhea and chlamydia, with rocephin and azithromycin. Please refrain from sexual activity for 7 days while medicine is clearing infection. ° °We are testing you for Gonorrhea, Chlamydia and Trichomonas. We will call you if anything is positive and let you know if you require any further treatment. Please inform partner of any positive results. ° °Please return if symptoms not improving with treatment, development of fever, nausea, vomiting, abdominal pain, scrotal pain. °

## 2017-08-04 LAB — URINE CYTOLOGY ANCILLARY ONLY
Chlamydia: NEGATIVE
Neisseria Gonorrhea: NEGATIVE
Trichomonas: NEGATIVE

## 2017-09-23 ENCOUNTER — Ambulatory Visit (HOSPITAL_COMMUNITY)
Admission: EM | Admit: 2017-09-23 | Discharge: 2017-09-23 | Disposition: A | Discharge status: Home / Self Care | Payer: Self-pay | Attending: Family Medicine | Admitting: Family Medicine

## 2017-09-23 ENCOUNTER — Other Ambulatory Visit: Payer: Self-pay

## 2017-09-23 ENCOUNTER — Encounter (HOSPITAL_COMMUNITY): Payer: Self-pay | Admitting: Emergency Medicine

## 2017-09-23 DIAGNOSIS — J45909 Unspecified asthma, uncomplicated: Secondary | ICD-10-CM | POA: Insufficient documentation

## 2017-09-23 DIAGNOSIS — R300 Dysuria: Secondary | ICD-10-CM

## 2017-09-23 DIAGNOSIS — F17210 Nicotine dependence, cigarettes, uncomplicated: Secondary | ICD-10-CM | POA: Insufficient documentation

## 2017-09-23 LAB — POCT URINALYSIS DIP (DEVICE)
Bilirubin Urine: NEGATIVE
Glucose, UA: NEGATIVE mg/dL
HGB URINE DIPSTICK: NEGATIVE
Ketones, ur: NEGATIVE mg/dL
Leukocytes, UA: NEGATIVE
Nitrite: NEGATIVE
PH: 6.5 (ref 5.0–8.0)
Protein, ur: NEGATIVE mg/dL
SPECIFIC GRAVITY, URINE: 1.02 (ref 1.005–1.030)
UROBILINOGEN UA: 0.2 mg/dL (ref 0.0–1.0)

## 2017-09-23 MED ORDER — AZITHROMYCIN 250 MG PO TABS
ORAL_TABLET | ORAL | Status: AC
  Filled 2017-09-23: qty 4

## 2017-09-23 MED ORDER — LIDOCAINE HCL (PF) 1 % IJ SOLN
INTRAMUSCULAR | Status: AC
  Filled 2017-09-23: qty 2

## 2017-09-23 MED ORDER — CEFTRIAXONE SODIUM 250 MG IJ SOLR
INTRAMUSCULAR | Status: AC
  Filled 2017-09-23: qty 250

## 2017-09-23 MED ORDER — CEFTRIAXONE SODIUM 250 MG IJ SOLR
250.0000 mg | Freq: Once | INTRAMUSCULAR | Status: AC
  Administered 2017-09-23: 250 mg via INTRAMUSCULAR

## 2017-09-23 MED ORDER — AZITHROMYCIN 250 MG PO TABS
1000.0000 mg | ORAL_TABLET | Freq: Once | ORAL | Status: AC
  Administered 2017-09-23: 1000 mg via ORAL
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
## 2017-09-23 NOTE — Discharge Instructions (Addendum)
We are giving you the standard treatment for possible STD.  We will notify you if any abnormality shows up in the test that we are sending off.

## 2017-09-23 NOTE — ED Notes (Signed)
Dirty and clean urine collected. 

## 2017-09-23 NOTE — ED Triage Notes (Signed)
[facility] a [facility] first thing in the am. Patient denied any discharge.

## 2017-09-24 LAB — URINE CYTOLOGY ANCILLARY ONLY
Chlamydia: NEGATIVE
Neisseria Gonorrhea: NEGATIVE
Trichomonas: NEGATIVE
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
## 2018-08-02 ENCOUNTER — Other Ambulatory Visit: Payer: Self-pay

## 2018-08-02 ENCOUNTER — Emergency Department (HOSPITAL_COMMUNITY): Payer: No Typology Code available for payment source

## 2018-08-02 ENCOUNTER — Emergency Department (HOSPITAL_COMMUNITY)
Admission: EM | Admit: 2018-08-02 | Discharge: 2018-08-02 | Disposition: A | Discharge status: Home / Self Care | Payer: No Typology Code available for payment source | Attending: Emergency Medicine | Admitting: Emergency Medicine

## 2018-08-02 ENCOUNTER — Encounter (HOSPITAL_COMMUNITY): Payer: Self-pay | Admitting: Emergency Medicine

## 2018-08-02 DIAGNOSIS — Z23 Encounter for immunization: Secondary | ICD-10-CM | POA: Insufficient documentation

## 2018-08-02 DIAGNOSIS — Y929 Unspecified place or not applicable: Secondary | ICD-10-CM | POA: Insufficient documentation

## 2018-08-02 DIAGNOSIS — S0083XA Contusion of other part of head, initial encounter: Secondary | ICD-10-CM | POA: Diagnosis not present

## 2018-08-02 DIAGNOSIS — R6884 Jaw pain: Secondary | ICD-10-CM | POA: Diagnosis not present

## 2018-08-02 DIAGNOSIS — F17210 Nicotine dependence, cigarettes, uncomplicated: Secondary | ICD-10-CM | POA: Diagnosis not present

## 2018-08-02 DIAGNOSIS — J45909 Unspecified asthma, uncomplicated: Secondary | ICD-10-CM | POA: Diagnosis not present

## 2018-08-02 DIAGNOSIS — Y939 Activity, unspecified: Secondary | ICD-10-CM | POA: Insufficient documentation

## 2018-08-02 DIAGNOSIS — M25521 Pain in right elbow: Secondary | ICD-10-CM | POA: Diagnosis not present

## 2018-08-02 DIAGNOSIS — M25552 Pain in left hip: Secondary | ICD-10-CM

## 2018-08-02 DIAGNOSIS — S0990XA Unspecified injury of head, initial encounter: Secondary | ICD-10-CM | POA: Diagnosis present

## 2018-08-02 DIAGNOSIS — Y999 Unspecified external cause status: Secondary | ICD-10-CM | POA: Insufficient documentation

## 2018-08-02 DIAGNOSIS — M79652 Pain in left thigh: Secondary | ICD-10-CM | POA: Insufficient documentation

## 2018-08-02 MED ORDER — METHOCARBAMOL 500 MG PO TABS: 500.0000 mg | ORAL_TABLET | Freq: Two times a day (BID) | ORAL | 0 refills | Status: DC

## 2018-08-02 MED ORDER — IBUPROFEN 600 MG PO TABS: 600.0000 mg | ORAL_TABLET | Freq: Four times a day (QID) | ORAL | 0 refills | Status: DC | PRN

## 2018-08-02 MED ORDER — IBUPROFEN 400 MG PO TABS
600.0000 mg | ORAL_TABLET | Freq: Once | ORAL | Status: AC
  Administered 2018-08-02: 18:00:00 600 mg via ORAL
  Filled 2018-08-02: qty 1

## 2018-08-02 MED ORDER — TETANUS-DIPHTH-ACELL PERTUSSIS 5-2.5-18.5 LF-MCG/0.5 IM SUSP
0.5000 mL | Freq: Once | INTRAMUSCULAR | Status: AC
  Administered 2018-08-02: 0.5 mL via INTRAMUSCULAR
  Filled 2018-08-02: qty 0.5

## 2018-08-02 NOTE — Discharge Instructions (Signed)

## 2018-08-02 NOTE — ED Notes (Signed)
Patient verbalizes understanding of discharge instructions. Opportunity for questioning and answers were provided. Armband removed by staff, pt discharged from ED ambulatory to home.  
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
## 2018-09-09 ENCOUNTER — Other Ambulatory Visit: Payer: Self-pay | Admitting: *Deleted

## 2018-09-09 DIAGNOSIS — Z20822 Contact with and (suspected) exposure to covid-19: Secondary | ICD-10-CM

## 2018-09-10 ENCOUNTER — Telehealth: Payer: Self-pay | Admitting: *Deleted

## 2018-09-10 LAB — NOVEL CORONAVIRUS, NAA: SARS-CoV-2, NAA: NOT DETECTED

## 2018-09-10 NOTE — Telephone Encounter (Signed)
Patient notified of negative COVID result- patient was tested due to sick child in the home. Advised continue to monitor for symptoms, safe practices and get flu shot

## 2018-12-23 ENCOUNTER — Other Ambulatory Visit: Payer: Self-pay

## 2018-12-23 ENCOUNTER — Encounter (HOSPITAL_COMMUNITY): Payer: Self-pay

## 2018-12-23 ENCOUNTER — Ambulatory Visit (HOSPITAL_COMMUNITY)
Admission: EM | Admit: 2018-12-23 | Discharge: 2018-12-23 | Disposition: A | Discharge status: Home / Self Care | Payer: Self-pay | Attending: Family Medicine | Admitting: Family Medicine

## 2018-12-23 DIAGNOSIS — Z7251 High risk heterosexual behavior: Secondary | ICD-10-CM | POA: Insufficient documentation

## 2018-12-23 DIAGNOSIS — N4889 Other specified disorders of penis: Secondary | ICD-10-CM | POA: Insufficient documentation

## 2018-12-23 MED ORDER — AZITHROMYCIN 250 MG PO TABS
ORAL_TABLET | ORAL | Status: AC
  Filled 2018-12-23: qty 4

## 2018-12-23 MED ORDER — CEFTRIAXONE SODIUM 250 MG IJ SOLR
INTRAMUSCULAR | Status: AC
  Filled 2018-12-23: qty 250

## 2018-12-23 MED ORDER — AZITHROMYCIN 250 MG PO TABS
1000.0000 mg | ORAL_TABLET | Freq: Once | ORAL | Status: AC
  Administered 2018-12-23: 17:00:00 1000 mg via ORAL

## 2018-12-23 MED ORDER — CEFTRIAXONE SODIUM 250 MG IJ SOLR
250.0000 mg | Freq: Once | INTRAMUSCULAR | Status: AC
  Administered 2018-12-23: 250 mg via INTRAMUSCULAR

## 2018-12-23 NOTE — Discharge Instructions (Signed)

## 2018-12-23 NOTE — ED Triage Notes (Signed)
Pt states he has tingling and discomfort in his penis X 2 days.
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
## 2018-12-25 ENCOUNTER — Telehealth (HOSPITAL_COMMUNITY): Payer: Self-pay | Admitting: Emergency Medicine

## 2018-12-25 LAB — CYTOLOGY, (ORAL, ANAL, URETHRAL) ANCILLARY ONLY
Chlamydia: POSITIVE — AB
Neisseria Gonorrhea: NEGATIVE
Trichomonas: NEGATIVE

## 2018-12-25 NOTE — Telephone Encounter (Signed)
Chlamydia is positive.  This was treated at the urgent care visit with po zithromax 1g.  Pt needs education to please refrain from sexual intercourse for 7 days to give the medicine time to work.  Sexual partners need to be notified and tested/treated.  Condoms may reduce risk of reinfection.  Recheck or followup with PCP for further evaluation if symptoms are not improving.  [facility] notified.  Patient contacted by phone and made aware of    results. Pt verbalized understanding and had all questions answered.

## 2019-02-26 ENCOUNTER — Other Ambulatory Visit: Payer: Self-pay

## 2019-02-26 ENCOUNTER — Emergency Department (HOSPITAL_COMMUNITY)
Admission: EM | Admit: 2019-02-26 | Discharge: 2019-02-27 | Disposition: A | Discharge status: Home / Self Care | Payer: Self-pay | Attending: Emergency Medicine | Admitting: Emergency Medicine

## 2019-02-26 ENCOUNTER — Encounter (HOSPITAL_COMMUNITY): Payer: Self-pay | Admitting: *Deleted

## 2019-02-26 DIAGNOSIS — J45909 Unspecified asthma, uncomplicated: Secondary | ICD-10-CM | POA: Insufficient documentation

## 2019-02-26 DIAGNOSIS — M25511 Pain in right shoulder: Secondary | ICD-10-CM | POA: Insufficient documentation

## 2019-02-26 DIAGNOSIS — F17210 Nicotine dependence, cigarettes, uncomplicated: Secondary | ICD-10-CM | POA: Insufficient documentation

## 2019-02-26 DIAGNOSIS — M545 Low back pain: Secondary | ICD-10-CM | POA: Insufficient documentation

## 2019-02-26 DIAGNOSIS — M25531 Pain in right wrist: Secondary | ICD-10-CM | POA: Insufficient documentation
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
## 2019-02-27 ENCOUNTER — Emergency Department (HOSPITAL_COMMUNITY): Payer: Self-pay

## 2019-02-27 MED ORDER — CYCLOBENZAPRINE HCL 5 MG PO TABS: 5.0000 mg | ORAL_TABLET | Freq: Three times a day (TID) | ORAL | 0 refills | Status: DC | PRN

## 2019-02-27 NOTE — ED Notes (Signed)
Patient verbalizes understanding of discharge instructions. Opportunity for questioning and answers were provided. Armband removed by staff, pt discharged from ED. Ambulated out to lobby  
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
## 2019-03-24 ENCOUNTER — Ambulatory Visit: Payer: Self-pay | Attending: Internal Medicine

## 2019-03-24 DIAGNOSIS — Z20822 Contact with and (suspected) exposure to covid-19: Secondary | ICD-10-CM

## 2019-03-25 LAB — NOVEL CORONAVIRUS, NAA: SARS-CoV-2, NAA: NOT DETECTED

## 2019-03-26 ENCOUNTER — Telehealth: Payer: Self-pay | Admitting: General Practice

## 2019-03-26 NOTE — Telephone Encounter (Signed)
Patient was calling to receive his COVID 19 test results. Patient expressed understanding.

## 2019-06-19 ENCOUNTER — Ambulatory Visit (HOSPITAL_COMMUNITY)
Admission: EM | Admit: 2019-06-19 | Discharge: 2019-06-19 | Disposition: A | Discharge status: Home / Self Care | Payer: Self-pay | Attending: Emergency Medicine | Admitting: Emergency Medicine

## 2019-06-19 ENCOUNTER — Other Ambulatory Visit: Payer: Self-pay

## 2019-06-19 DIAGNOSIS — Z202 Contact with and (suspected) exposure to infections with a predominantly sexual mode of transmission: Secondary | ICD-10-CM

## 2019-06-19 MED ORDER — CEFTRIAXONE SODIUM 500 MG IJ SOLR
500.0000 mg | Freq: Once | INTRAMUSCULAR | Status: AC
  Administered 2019-06-19: 500 mg via INTRAMUSCULAR

## 2019-06-19 MED ORDER — CEFTRIAXONE SODIUM 500 MG IJ SOLR
INTRAMUSCULAR | Status: AC
  Filled 2019-06-19: qty 500

## 2019-06-19 MED ORDER — DOXYCYCLINE HYCLATE 100 MG PO CAPS: 100.0000 mg | ORAL_CAPSULE | Freq: Two times a day (BID) | ORAL | 0 refills | Status: AC

## 2019-06-19 MED ORDER — LIDOCAINE HCL (PF) 1 % IJ SOLN
INTRAMUSCULAR | Status: AC
  Filled 2019-06-19: qty 2
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
## 2019-06-19 NOTE — Discharge Instructions (Signed)
We have treated you today for gonorrhea, with rocephin. Continue with doxycycline twice daily for 1 week to treat chlamydia. Please refrain from sexual activity for 7 days while medicine is clearing infection.  We are testing you for Gonorrhea, Chlamydia and Trichomonas. We will call you if anything is positive and let you know if you require any further treatment. Please inform partner of any positive results.  Please return if symptoms not improving with treatment, development of fever, nausea, vomiting, abdominal pain, scrotal pain. 
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
## 2019-06-21 LAB — CYTOLOGY, (ORAL, ANAL, URETHRAL) ANCILLARY ONLY
Chlamydia: NEGATIVE
Comment: NEGATIVE
Comment: NEGATIVE
Comment: NORMAL
Neisseria Gonorrhea: NEGATIVE
Trichomonas: NEGATIVE

## 2019-06-22 LAB — HSV CULTURE AND TYPING

## 2019-12-16 ENCOUNTER — Encounter (HOSPITAL_COMMUNITY): Payer: Self-pay

## 2019-12-16 ENCOUNTER — Ambulatory Visit (HOSPITAL_COMMUNITY)
Admission: EM | Admit: 2019-12-16 | Discharge: 2019-12-16 | Disposition: A | Discharge status: Home / Self Care | Payer: Self-pay | Attending: Emergency Medicine | Admitting: Emergency Medicine

## 2019-12-16 ENCOUNTER — Other Ambulatory Visit: Payer: Self-pay

## 2019-12-16 DIAGNOSIS — R300 Dysuria: Secondary | ICD-10-CM | POA: Insufficient documentation

## 2019-12-16 DIAGNOSIS — Z113 Encounter for screening for infections with a predominantly sexual mode of transmission: Secondary | ICD-10-CM | POA: Insufficient documentation

## 2019-12-16 LAB — POCT URINALYSIS DIPSTICK, ED / UC
Bilirubin Urine: NEGATIVE
Glucose, UA: NEGATIVE mg/dL
Hgb urine dipstick: NEGATIVE
Ketones, ur: NEGATIVE mg/dL
Nitrite: NEGATIVE
Protein, ur: NEGATIVE mg/dL
Specific Gravity, Urine: 1.02 (ref 1.005–1.030)
Urobilinogen, UA: 1 mg/dL (ref 0.0–1.0)
pH: 7 (ref 5.0–8.0)

## 2019-12-16 NOTE — Discharge Instructions (Signed)
Urine culture is pending.  We will call you if it shows the need for treatment.    Your STD tests are pending.  If your test results are positive, we will call you.  You and your sexual partner(s) may require treatment at that time.  Do not have sexual activity until the test results are back.
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
## 2019-12-16 NOTE — ED Triage Notes (Signed)
Pt states he has pain when he voids. ( itching  And burning ) x 3 days.

## 2019-12-17 LAB — URINE CULTURE: Culture: NO GROWTH

## 2019-12-17 LAB — CYTOLOGY, (ORAL, ANAL, URETHRAL) ANCILLARY ONLY
Chlamydia: NEGATIVE
Comment: NEGATIVE
Comment: NEGATIVE
Comment: NORMAL
Neisseria Gonorrhea: NEGATIVE
Trichomonas: NEGATIVE

## 2020-01-08 ENCOUNTER — Ambulatory Visit (HOSPITAL_COMMUNITY)
Admission: EM | Admit: 2020-01-08 | Discharge: 2020-01-08 | Disposition: A | Discharge status: Home / Self Care | Payer: Self-pay | Attending: Family Medicine | Admitting: Family Medicine

## 2020-01-08 ENCOUNTER — Other Ambulatory Visit: Payer: Self-pay

## 2020-01-08 ENCOUNTER — Encounter (HOSPITAL_COMMUNITY): Payer: Self-pay

## 2020-01-08 DIAGNOSIS — R369 Urethral discharge, unspecified: Secondary | ICD-10-CM | POA: Insufficient documentation

## 2020-01-08 MED ORDER — CEFTRIAXONE SODIUM 500 MG IJ SOLR
INTRAMUSCULAR | Status: AC
  Filled 2020-01-08: qty 500

## 2020-01-08 MED ORDER — DOXYCYCLINE HYCLATE 100 MG PO CAPS: 100.0000 mg | ORAL_CAPSULE | Freq: Two times a day (BID) | ORAL | 0 refills | Status: DC

## 2020-01-08 MED ORDER — CEFTRIAXONE SODIUM 500 MG IJ SOLR
500.0000 mg | Freq: Once | INTRAMUSCULAR | Status: AC
  Administered 2020-01-08: 500 mg via INTRAMUSCULAR

## 2020-01-08 MED ORDER — LIDOCAINE HCL (PF) 1 % IJ SOLN
INTRAMUSCULAR | Status: AC
  Filled 2020-01-08: qty 2

## 2020-01-08 NOTE — ED Triage Notes (Signed)
Pt presents for STD's testing. Reports discomfort with urination x 3 weeks. Denies fever, chills, penile discharge.

## 2020-01-08 NOTE — Discharge Instructions (Addendum)
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
## 2020-01-11 LAB — CYTOLOGY, (ORAL, ANAL, URETHRAL) ANCILLARY ONLY
Chlamydia: NEGATIVE
Comment: NEGATIVE
Comment: NEGATIVE
Comment: NORMAL
Neisseria Gonorrhea: NEGATIVE
Trichomonas: NEGATIVE

## 2020-02-09 ENCOUNTER — Ambulatory Visit (HOSPITAL_COMMUNITY)
Admission: EM | Admit: 2020-02-09 | Discharge: 2020-02-09 | Disposition: A | Discharge status: Home / Self Care | Payer: Self-pay | Attending: Emergency Medicine | Admitting: Emergency Medicine

## 2020-02-09 ENCOUNTER — Encounter (HOSPITAL_COMMUNITY): Payer: Self-pay

## 2020-02-09 DIAGNOSIS — N489 Disorder of penis, unspecified: Secondary | ICD-10-CM

## 2020-02-09 DIAGNOSIS — Z202 Contact with and (suspected) exposure to infections with a predominantly sexual mode of transmission: Secondary | ICD-10-CM

## 2020-02-09 LAB — HIV ANTIBODY (ROUTINE TESTING W REFLEX): HIV Screen 4th Generation wRfx: NONREACTIVE

## 2020-02-09 NOTE — ED Triage Notes (Signed)
Pt reports having 2 bumps on private area x 1 week. He states he came last month to get tested for an STD. He states it came back negative. Pt states he is not having penile discharge or discomfort when urinating.

## 2020-02-09 NOTE — Discharge Instructions (Addendum)
Your STD tests are pending.  If your test results are positive, we will call you.  You and your sexual partner(s) may require treatment at that time.  Do not have sexual activity until the test results are back.    Follow up with your primary care provider if your symptoms are not improving.
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
## 2020-02-10 LAB — CYTOLOGY, (ORAL, ANAL, URETHRAL) ANCILLARY ONLY
Chlamydia: NEGATIVE
Comment: NEGATIVE
Comment: NEGATIVE
Comment: NORMAL
Neisseria Gonorrhea: NEGATIVE
Trichomonas: NEGATIVE

## 2020-02-10 LAB — RPR: RPR Ser Ql: NONREACTIVE

## 2020-02-12 LAB — HSV CULTURE AND TYPING

## 2020-04-21 ENCOUNTER — Encounter (HOSPITAL_COMMUNITY): Payer: Self-pay

## 2020-04-21 ENCOUNTER — Emergency Department (HOSPITAL_COMMUNITY): Payer: Self-pay

## 2020-04-21 ENCOUNTER — Emergency Department (HOSPITAL_COMMUNITY)
Admission: EM | Admit: 2020-04-21 | Discharge: 2020-04-22 | Disposition: A | Discharge status: Home / Self Care | Payer: Self-pay | Attending: Emergency Medicine | Admitting: Emergency Medicine

## 2020-04-21 DIAGNOSIS — T1490XA Injury, unspecified, initial encounter: Secondary | ICD-10-CM

## 2020-04-21 DIAGNOSIS — S2242XB Multiple fractures of ribs, left side, initial encounter for open fracture: Secondary | ICD-10-CM

## 2020-04-21 DIAGNOSIS — W3400XA Accidental discharge from unspecified firearms or gun, initial encounter: Secondary | ICD-10-CM

## 2020-04-21 DIAGNOSIS — Y906 Blood alcohol level of 120-199 mg/100 ml: Secondary | ICD-10-CM | POA: Insufficient documentation

## 2020-04-21 DIAGNOSIS — S3991XA Unspecified injury of abdomen, initial encounter: Secondary | ICD-10-CM | POA: Insufficient documentation

## 2020-04-21 DIAGNOSIS — R000 Tachycardia, unspecified: Secondary | ICD-10-CM | POA: Insufficient documentation

## 2020-04-21 DIAGNOSIS — Z20822 Contact with and (suspected) exposure to covid-19: Secondary | ICD-10-CM | POA: Insufficient documentation

## 2020-04-21 LAB — I-STAT CHEM 8, ED
BUN: 11 mg/dL (ref 6–20)
Calcium, Ion: 1.02 mmol/L — ABNORMAL LOW (ref 1.15–1.40)
Chloride: 106 mmol/L (ref 98–111)
Creatinine, Ser: 1.5 mg/dL — ABNORMAL HIGH (ref 0.61–1.24)
Glucose, Bld: 141 mg/dL — ABNORMAL HIGH (ref 70–99)
HCT: 48 % (ref 39.0–52.0)
Hemoglobin: 16.3 g/dL (ref 13.0–17.0)
Potassium: 3.1 mmol/L — ABNORMAL LOW (ref 3.5–5.1)
Sodium: 141 mmol/L (ref 135–145)
TCO2: 15 mmol/L — ABNORMAL LOW (ref 22–32)

## 2020-04-21 LAB — CBC
HCT: 49.1 % (ref 39.0–52.0)
Hemoglobin: 16.5 g/dL (ref 13.0–17.0)
MCH: 31.5 pg (ref 26.0–34.0)
MCHC: 33.6 g/dL (ref 30.0–36.0)
MCV: 93.7 fL (ref 80.0–100.0)
Platelets: 288 10*3/uL (ref 150–400)
RBC: 5.24 MIL/uL (ref 4.22–5.81)
RDW: 13 % (ref 11.5–15.5)
WBC: 12.1 10*3/uL — ABNORMAL HIGH (ref 4.0–10.5)
nRBC: 0 % (ref 0.0–0.2)

## 2020-04-21 MED ORDER — IOHEXOL 300 MG/ML  SOLN
100.0000 mL | Freq: Once | INTRAMUSCULAR | Status: AC | PRN
  Administered 2020-04-21: 100 mL via INTRAVENOUS

## 2020-04-21 MED ORDER — ONDANSETRON HCL 4 MG/2ML IJ SOLN
INTRAMUSCULAR | Status: AC | PRN
Start: 2020-04-21 — End: 2020-04-21
  Administered 2020-04-21: 4 mg via INTRAVENOUS

## 2020-04-21 MED ORDER — MORPHINE SULFATE 2 MG/ML IJ SOLN
INTRAMUSCULAR | Status: AC | PRN
  Administered 2020-04-21: 4 mg via INTRAVENOUS

## 2020-04-21 MED ORDER — ONDANSETRON HCL 4 MG/2ML IJ SOLN
INTRAMUSCULAR | Status: AC
  Filled 2020-04-21: qty 2

## 2020-04-21 NOTE — ED Notes (Signed)
Patient transported to CT 
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
## 2020-04-22 LAB — COMPREHENSIVE METABOLIC PANEL
ALT: 39 U/L (ref 0–44)
AST: 35 U/L (ref 15–41)
Albumin: 4.5 g/dL (ref 3.5–5.0)
Alkaline Phosphatase: 51 U/L (ref 38–126)
Anion gap: 20 — ABNORMAL HIGH (ref 5–15)
BUN: 10 mg/dL (ref 6–20)
CO2: 14 mmol/L — ABNORMAL LOW (ref 22–32)
Calcium: 9 mg/dL (ref 8.9–10.3)
Chloride: 106 mmol/L (ref 98–111)
Creatinine, Ser: 1.54 mg/dL — ABNORMAL HIGH (ref 0.61–1.24)
GFR, Estimated: >60 mL/min (ref >60)
Glucose, Bld: 145 mg/dL — ABNORMAL HIGH (ref 70–99)
Potassium: 3.1 mmol/L — ABNORMAL LOW (ref 3.5–5.1)
Sodium: 140 mmol/L (ref 135–145)
Total Bilirubin: 1 mg/dL (ref 0.3–1.2)
Total Protein: 7.5 g/dL (ref 6.5–8.1)

## 2020-04-22 LAB — PROTIME-INR
INR: 1 (ref 0.8–1.2)
Prothrombin Time: 12.9 seconds (ref 11.4–15.2)

## 2020-04-22 LAB — RESP PANEL BY RT-PCR (FLU A&B, COVID) ARPGX2
Influenza A by PCR: NEGATIVE
Influenza B by PCR: NEGATIVE
SARS Coronavirus 2 by RT PCR: NEGATIVE

## 2020-04-22 LAB — LACTIC ACID, PLASMA
Lactic Acid, Venous: 9.7 mmol/L (ref 0.5–1.9)
Lactic Acid, Venous: 2.5 mmol/L (ref 0.5–1.9)

## 2020-04-22 LAB — SAMPLE TO BLOOD BANK

## 2020-04-22 LAB — ETHANOL: Alcohol, Ethyl (B): 167 mg/dL — ABNORMAL HIGH (ref <10)

## 2020-04-22 MED ORDER — OXYCODONE-ACETAMINOPHEN 5-325 MG PO TABS
1.0000 | ORAL_TABLET | Freq: Once | ORAL | Status: AC
Start: 2020-04-22 — End: 2020-04-22
  Administered 2020-04-22: 1 via ORAL
  Filled 2020-04-22: qty 1

## 2020-04-22 MED ORDER — KETOROLAC TROMETHAMINE 30 MG/ML IJ SOLN
30.0000 mg | Freq: Once | INTRAMUSCULAR | Status: AC
  Administered 2020-04-22: 30 mg via INTRAVENOUS
  Filled 2020-04-22: qty 1

## 2020-04-22 MED ORDER — NAPROXEN 500 MG PO TABS: 500.0000 mg | ORAL_TABLET | Freq: Two times a day (BID) | ORAL | 0 refills | Status: DC

## 2020-04-22 MED ORDER — OXYCODONE-ACETAMINOPHEN 5-325 MG PO TABS: 1.0000 | ORAL_TABLET | Freq: Four times a day (QID) | ORAL | 0 refills | Status: DC | PRN
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
## 2020-04-23 ENCOUNTER — Emergency Department (HOSPITAL_COMMUNITY): Payer: Self-pay

## 2020-04-23 ENCOUNTER — Encounter (HOSPITAL_COMMUNITY): Payer: Self-pay | Admitting: Emergency Medicine

## 2020-04-23 ENCOUNTER — Ambulatory Visit (HOSPITAL_COMMUNITY)
Admission: EM | Admit: 2020-04-23 | Discharge: 2020-04-23 | Disposition: A | Discharge status: Home / Self Care | Payer: Self-pay

## 2020-04-23 ENCOUNTER — Emergency Department (HOSPITAL_COMMUNITY)
Admission: EM | Admit: 2020-04-23 | Discharge: 2020-04-23 | Disposition: A | Discharge status: Home / Self Care | Payer: Self-pay | Attending: Emergency Medicine | Admitting: Emergency Medicine

## 2020-04-23 ENCOUNTER — Other Ambulatory Visit: Payer: Self-pay

## 2020-04-23 DIAGNOSIS — J45909 Unspecified asthma, uncomplicated: Secondary | ICD-10-CM | POA: Insufficient documentation

## 2020-04-23 DIAGNOSIS — S2242XD Multiple fractures of ribs, left side, subsequent encounter for fracture with routine healing: Secondary | ICD-10-CM | POA: Insufficient documentation

## 2020-04-23 DIAGNOSIS — W3400XD Accidental discharge from unspecified firearms or gun, subsequent encounter: Secondary | ICD-10-CM | POA: Insufficient documentation

## 2020-04-23 DIAGNOSIS — F17210 Nicotine dependence, cigarettes, uncomplicated: Secondary | ICD-10-CM | POA: Insufficient documentation

## 2020-04-23 MED ORDER — HYDROMORPHONE HCL 1 MG/ML IJ SOLN: 1.0000 mg | Freq: Once | INTRAMUSCULAR | Status: DC

## 2020-04-23 MED ORDER — HYDROMORPHONE HCL 1 MG/ML IJ SOLN
1.0000 mg | Freq: Once | INTRAMUSCULAR | Status: AC
  Administered 2020-04-23: 1 mg via INTRAMUSCULAR
  Filled 2020-04-23: qty 1
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
## 2020-04-23 NOTE — Discharge Instructions (Addendum)
Continue taking the pain medications as prescribed. Apply ice to the area as needed. Return to the ER if you start to experience shortness of breath, cough, vomiting or fever.
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
## 2020-04-24 ENCOUNTER — Encounter (HOSPITAL_COMMUNITY): Payer: Self-pay | Admitting: Emergency Medicine

## 2020-04-27 ENCOUNTER — Ambulatory Visit (HOSPITAL_COMMUNITY)
Admission: EM | Admit: 2020-04-27 | Discharge: 2020-04-27 | Disposition: A | Discharge status: Home / Self Care | Payer: Self-pay | Attending: Urgent Care | Admitting: Urgent Care

## 2020-04-27 ENCOUNTER — Other Ambulatory Visit: Payer: Self-pay

## 2020-04-27 ENCOUNTER — Encounter (HOSPITAL_COMMUNITY): Payer: Self-pay | Admitting: Emergency Medicine

## 2020-04-27 DIAGNOSIS — Z87828 Personal history of other (healed) physical injury and trauma: Secondary | ICD-10-CM

## 2020-04-27 DIAGNOSIS — R079 Chest pain, unspecified: Secondary | ICD-10-CM

## 2020-04-27 DIAGNOSIS — Z5189 Encounter for other specified aftercare: Secondary | ICD-10-CM

## 2020-04-27 MED ORDER — TIZANIDINE HCL 4 MG PO TABS: 4.0000 mg | ORAL_TABLET | Freq: Three times a day (TID) | ORAL | 0 refills | Status: DC | PRN

## 2020-04-27 MED ORDER — NAPROXEN 500 MG PO TABS: 500.0000 mg | ORAL_TABLET | Freq: Two times a day (BID) | ORAL | 0 refills | Status: DC

## 2020-04-27 MED ORDER — TRAMADOL HCL 50 MG PO TABS: 50.0000 mg | ORAL_TABLET | Freq: Four times a day (QID) | ORAL | 0 refills | Status: DC | PRN
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
## 2020-04-27 NOTE — Discharge Instructions (Signed)
Please change your dressing 2-3 times daily. Do not apply any ointments or creams. Each time you change your dressing, make sure you clean gently around the perimeter of the wound with gentle soap and warm water. Pat your wound dry and let it air out if possible before reapplying another dressing.   Take naproxen twice daily for pain control.  This will help with inflammation as well.  Use tramadol for breakthrough pain if naproxen is not helping.  Unfortunately we do not provide medication refills for narcotic medications.  And therefore reviewed return to clinic for wound check we can help you but we will not refill narcotic pain medicine like tramadol or oxycodone.

## 2020-05-26 ENCOUNTER — Emergency Department (HOSPITAL_COMMUNITY): Payer: Self-pay

## 2020-05-26 ENCOUNTER — Other Ambulatory Visit: Payer: Self-pay

## 2020-05-26 ENCOUNTER — Encounter (HOSPITAL_COMMUNITY): Payer: Self-pay

## 2020-05-26 ENCOUNTER — Emergency Department (HOSPITAL_COMMUNITY)
Admission: EM | Admit: 2020-05-26 | Discharge: 2020-05-26 | Disposition: A | Discharge status: Home / Self Care | Payer: Self-pay | Attending: Emergency Medicine | Admitting: Emergency Medicine

## 2020-05-26 DIAGNOSIS — R7402 Elevation of levels of lactic acid dehydrogenase (LDH): Secondary | ICD-10-CM | POA: Insufficient documentation

## 2020-05-26 DIAGNOSIS — J45909 Unspecified asthma, uncomplicated: Secondary | ICD-10-CM | POA: Insufficient documentation

## 2020-05-26 DIAGNOSIS — F17210 Nicotine dependence, cigarettes, uncomplicated: Secondary | ICD-10-CM | POA: Insufficient documentation

## 2020-05-26 DIAGNOSIS — R519 Headache, unspecified: Secondary | ICD-10-CM | POA: Insufficient documentation

## 2020-05-26 DIAGNOSIS — R569 Unspecified convulsions: Secondary | ICD-10-CM | POA: Insufficient documentation

## 2020-05-26 DIAGNOSIS — F1410 Cocaine abuse, uncomplicated: Secondary | ICD-10-CM | POA: Insufficient documentation

## 2020-05-26 DIAGNOSIS — N289 Disorder of kidney and ureter, unspecified: Secondary | ICD-10-CM | POA: Insufficient documentation

## 2020-05-26 DIAGNOSIS — R7989 Other specified abnormal findings of blood chemistry: Secondary | ICD-10-CM

## 2020-05-26 HISTORY — DX: Unspecified intracranial injury with loss of consciousness of unspecified duration, initial encounter: S06.9X9A

## 2020-05-26 HISTORY — DX: Unspecified intracranial injury with loss of consciousness status unknown, initial encounter: S06.9XAA

## 2020-05-26 LAB — COMPREHENSIVE METABOLIC PANEL
ALT: 39 U/L (ref 0–44)
AST: 28 U/L (ref 15–41)
Albumin: 4.1 g/dL (ref 3.5–5.0)
Alkaline Phosphatase: 66 U/L (ref 38–126)
Anion gap: 7 (ref 5–15)
BUN: 12 mg/dL (ref 6–20)
CO2: 24 mmol/L (ref 22–32)
Calcium: 9.2 mg/dL (ref 8.9–10.3)
Chloride: 106 mmol/L (ref 98–111)
Creatinine, Ser: 1.41 mg/dL — ABNORMAL HIGH (ref 0.61–1.24)
GFR, Estimated: >60 mL/min (ref >60)
Glucose, Bld: 116 mg/dL — ABNORMAL HIGH (ref 70–99)
Potassium: 3.6 mmol/L (ref 3.5–5.1)
Sodium: 137 mmol/L (ref 135–145)
Total Bilirubin: 0.5 mg/dL (ref 0.3–1.2)
Total Protein: 6.9 g/dL (ref 6.5–8.1)

## 2020-05-26 LAB — CBC WITH DIFFERENTIAL/PLATELET
Abs Immature Granulocytes: 0.06 10*3/uL (ref 0.00–0.07)
Basophils Absolute: 0 10*3/uL (ref 0.0–0.1)
Basophils Relative: 0 %
Eosinophils Absolute: 0 10*3/uL (ref 0.0–0.5)
Eosinophils Relative: 0 %
HCT: 46.7 % (ref 39.0–52.0)
Hemoglobin: 16.1 g/dL (ref 13.0–17.0)
Immature Granulocytes: 1 %
Lymphocytes Relative: 12 %
Lymphs Abs: 1.4 10*3/uL (ref 0.7–4.0)
MCH: 30.8 pg (ref 26.0–34.0)
MCHC: 34.5 g/dL (ref 30.0–36.0)
MCV: 89.5 fL (ref 80.0–100.0)
Monocytes Absolute: 1.1 10*3/uL — ABNORMAL HIGH (ref 0.1–1.0)
Monocytes Relative: 9 %
Neutro Abs: 9.4 10*3/uL — ABNORMAL HIGH (ref 1.7–7.7)
Neutrophils Relative %: 78 %
Platelets: 237 10*3/uL (ref 150–400)
RBC: 5.22 MIL/uL (ref 4.22–5.81)
RDW: 13.2 % (ref 11.5–15.5)
WBC: 11.9 10*3/uL — ABNORMAL HIGH (ref 4.0–10.5)
nRBC: 0 % (ref 0.0–0.2)

## 2020-05-26 LAB — RAPID URINE DRUG SCREEN, HOSP PERFORMED
Amphetamines: NOT DETECTED
Barbiturates: NOT DETECTED
Benzodiazepines: NOT DETECTED
Cocaine: POSITIVE — AB
Opiates: NOT DETECTED
Tetrahydrocannabinol: POSITIVE — AB

## 2020-05-26 LAB — CK: Total CK: 535 U/L — ABNORMAL HIGH (ref 49–397)

## 2020-05-26 LAB — LACTIC ACID, PLASMA
Lactic Acid, Venous: 3.1 mmol/L (ref 0.5–1.9)
Lactic Acid, Venous: 1.6 mmol/L (ref 0.5–1.9)

## 2020-05-26 LAB — ETHANOL: Alcohol, Ethyl (B): <10 mg/dL (ref <10)

## 2020-05-26 MED ORDER — LEVETIRACETAM IN NACL 1500 MG/100ML IV SOLN
1500.0000 mg | Freq: Once | INTRAVENOUS | Status: DC
  Filled 2020-05-26: qty 100

## 2020-05-26 MED ORDER — ACETAMINOPHEN 325 MG PO TABS
650.0000 mg | ORAL_TABLET | Freq: Once | ORAL | Status: AC
  Administered 2020-05-26: 650 mg via ORAL
  Filled 2020-05-26: qty 2

## 2020-05-26 MED ORDER — LEVETIRACETAM 500 MG PO TABS: 500.0000 mg | ORAL_TABLET | Freq: Two times a day (BID) | ORAL | 0 refills | Status: DC

## 2020-05-26 MED ORDER — LEVETIRACETAM 500 MG PO TABS
1500.0000 mg | ORAL_TABLET | Freq: Once | ORAL | Status: AC
  Administered 2020-05-26: 1500 mg via ORAL
  Filled 2020-05-26: qty 3
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
## 2020-05-26 NOTE — ED Notes (Signed)
Pt is in CT scan at this time. 
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
## 2020-05-26 NOTE — ED Notes (Signed)
Dr.Glick is at bedside.

## 2020-05-26 NOTE — ED Notes (Signed)
Pt d/c home per MD order. Discharge summary reviewed, pt verbalizes understanding. No s/s of acute distress noted at discharge . Ambulatory off unit. Discharged home , visitor is driver.

## 2020-05-26 NOTE — Discharge Instructions (Signed)
Please be aware that the law in [place] states that she may not drive a car if you have had a seizure in the past 6 months.  Do not drink alcohol or use cocaine or other drugs because they can lead to you having additional seizures.
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
## 2020-06-08 ENCOUNTER — Ambulatory Visit: Payer: Self-pay | Admitting: Neurology

## 2020-07-24 ENCOUNTER — Ambulatory Visit (INDEPENDENT_AMBULATORY_CARE_PROVIDER_SITE_OTHER): Payer: Self-pay | Admitting: Diagnostic Neuroimaging

## 2020-07-24 ENCOUNTER — Other Ambulatory Visit: Payer: Self-pay

## 2020-07-24 ENCOUNTER — Encounter: Payer: Self-pay | Admitting: Diagnostic Neuroimaging

## 2020-07-24 VITALS — BP 135/80 | HR 76 | Ht 75.0 in | Wt 274.0 lb

## 2020-07-24 DIAGNOSIS — R569 Unspecified convulsions: Secondary | ICD-10-CM

## 2020-07-24 DIAGNOSIS — Z87828 Personal history of other (healed) physical injury and trauma: Secondary | ICD-10-CM

## 2020-07-24 MED ORDER — LEVETIRACETAM 500 MG PO TABS: 500.0000 mg | ORAL_TABLET | Freq: Two times a day (BID) | ORAL | 11 refills | Status: DC
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
## 2020-07-24 NOTE — Patient Instructions (Signed)
NEW ONSET SEIZURE  - recommend to continue levetiracetam 500mg  twice a day  - According to [place] law, you can not drive unless you are seizure / syncope free for at least 6 months and under physician's care.   - Please maintain precautions. Do not participate in activities where a loss of awareness could harm you or someone else. No swimming alone, no tub bathing, no hot tubs, no driving, no operating motorized vehicles (cars, ATVs, motocycles, etc), lawnmowers, power tools or firearms. No standing at heights, such as rooftops, ladders or stairs. Avoid hot objects such as stoves, heaters, open fires. Wear a helmet when riding a bicycle, scooter, skateboard, etc. and avoid areas of traffic. Set your water heater to 120 degrees or less.

## 2020-08-15 ENCOUNTER — Encounter (HOSPITAL_COMMUNITY): Payer: Self-pay | Admitting: Emergency Medicine

## 2020-08-15 ENCOUNTER — Ambulatory Visit (HOSPITAL_COMMUNITY)
Admission: EM | Admit: 2020-08-15 | Discharge: 2020-08-15 | Disposition: A | Discharge status: Home / Self Care | Payer: Self-pay | Attending: Physician Assistant | Admitting: Physician Assistant

## 2020-08-15 ENCOUNTER — Ambulatory Visit (INDEPENDENT_AMBULATORY_CARE_PROVIDER_SITE_OTHER): Payer: Self-pay

## 2020-08-15 ENCOUNTER — Other Ambulatory Visit: Payer: Self-pay

## 2020-08-15 DIAGNOSIS — F17210 Nicotine dependence, cigarettes, uncomplicated: Secondary | ICD-10-CM | POA: Insufficient documentation

## 2020-08-15 DIAGNOSIS — Z113 Encounter for screening for infections with a predominantly sexual mode of transmission: Secondary | ICD-10-CM

## 2020-08-15 DIAGNOSIS — T148XXA Other injury of unspecified body region, initial encounter: Secondary | ICD-10-CM

## 2020-08-15 DIAGNOSIS — R300 Dysuria: Secondary | ICD-10-CM

## 2020-08-15 DIAGNOSIS — M79672 Pain in left foot: Secondary | ICD-10-CM

## 2020-08-15 DIAGNOSIS — W25XXXA Contact with sharp glass, initial encounter: Secondary | ICD-10-CM

## 2020-08-15 LAB — POCT URINALYSIS DIPSTICK, ED / UC
Bilirubin Urine: NEGATIVE
Glucose, UA: NEGATIVE mg/dL
Leukocytes,Ua: NEGATIVE
Nitrite: NEGATIVE
Protein, ur: NEGATIVE mg/dL
Specific Gravity, Urine: >=1.03 (ref 1.005–1.030)
Urobilinogen, UA: 1 mg/dL (ref 0.0–1.0)
pH: 6 (ref 5.0–8.0)

## 2020-08-15 LAB — HIV ANTIBODY (ROUTINE TESTING W REFLEX): HIV Screen 4th Generation wRfx: NONREACTIVE

## 2020-08-15 LAB — HEPATITIS PANEL, ACUTE
HCV Ab: NONREACTIVE
Hep A IgM: NONREACTIVE
Hep B C IgM: NONREACTIVE
Hepatitis B Surface Ag: NONREACTIVE

## 2020-08-15 MED ORDER — LIDOCAINE HCL (PF) 1 % IJ SOLN
INTRAMUSCULAR | Status: AC
  Filled 2020-08-15: qty 2

## 2020-08-15 MED ORDER — LIDOCAINE HCL (PF) 2 % IJ SOLN
INTRAMUSCULAR | Status: AC
  Filled 2020-08-15: qty 5

## 2020-08-15 MED ORDER — CEFTRIAXONE SODIUM 500 MG IJ SOLR
INTRAMUSCULAR | Status: AC
  Filled 2020-08-15: qty 500

## 2020-08-15 MED ORDER — DOXYCYCLINE HYCLATE 100 MG PO CAPS: 100.0000 mg | ORAL_CAPSULE | Freq: Two times a day (BID) | ORAL | 0 refills | Status: DC

## 2020-08-15 MED ORDER — CEFTRIAXONE SODIUM 500 MG IJ SOLR
500.0000 mg | Freq: Once | INTRAMUSCULAR | Status: AC
  Administered 2020-08-15: 500 mg via INTRAMUSCULAR
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
## 2020-08-15 NOTE — Discharge Instructions (Signed)
We were able to remove a small glass shard spine hoping that your pain will improve.  Please take doxycycline twice daily to cover for infection given drainage associated with this.  Stay out of the sun while on this medication.  I would recommend following up with a podiatrist particularly if symptoms do not improve quickly.  You can use over-the-counter medications including Tylenol ibuprofen for pain relief.  If you have any fevers, significant drainage, increased pain you need to go to the emergency room.  We treated you for gonorrhea.  We will contact you with your other results if we need to arrange any treatment.  Please abstain from sexual intercourse until results are obtained.  If you are positive all partners will need to be assessed and treated.

## 2020-08-16 LAB — CYTOLOGY, (ORAL, ANAL, URETHRAL) ANCILLARY ONLY
Chlamydia: NEGATIVE
Comment: NEGATIVE
Comment: NEGATIVE
Comment: NORMAL
Neisseria Gonorrhea: NEGATIVE
Trichomonas: NEGATIVE

## 2020-08-16 LAB — RPR: RPR Ser Ql: NONREACTIVE

## 2020-08-17 LAB — URINE CULTURE: Culture: 3000 — AB

## 2020-08-27 ENCOUNTER — Emergency Department (HOSPITAL_COMMUNITY)
Admission: EM | Admit: 2020-08-27 | Discharge: 2020-08-27 | Disposition: A | Discharge status: Home / Self Care | Payer: Self-pay | Attending: Emergency Medicine | Admitting: Emergency Medicine

## 2020-08-27 ENCOUNTER — Emergency Department (HOSPITAL_BASED_OUTPATIENT_CLINIC_OR_DEPARTMENT_OTHER): Payer: Self-pay

## 2020-08-27 ENCOUNTER — Emergency Department (HOSPITAL_COMMUNITY): Payer: Self-pay

## 2020-08-27 ENCOUNTER — Other Ambulatory Visit: Payer: Self-pay

## 2020-08-27 ENCOUNTER — Encounter (HOSPITAL_COMMUNITY): Payer: Self-pay | Admitting: Oncology

## 2020-08-27 DIAGNOSIS — F17210 Nicotine dependence, cigarettes, uncomplicated: Secondary | ICD-10-CM | POA: Insufficient documentation

## 2020-08-27 DIAGNOSIS — M7989 Other specified soft tissue disorders: Secondary | ICD-10-CM

## 2020-08-27 DIAGNOSIS — J45909 Unspecified asthma, uncomplicated: Secondary | ICD-10-CM | POA: Insufficient documentation

## 2020-08-27 DIAGNOSIS — R609 Edema, unspecified: Secondary | ICD-10-CM

## 2020-08-27 DIAGNOSIS — I872 Venous insufficiency (chronic) (peripheral): Secondary | ICD-10-CM

## 2020-08-27 NOTE — Progress Notes (Signed)
VASCULAR LAB    Left lower extremity venous duplex has been performed.  See CV proc for preliminary results.  Gave verbal report to Dr. Georgana Curio, [facility], RVT 08/27/2020, 5:52 PM
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
## 2020-08-27 NOTE — Discharge Instructions (Addendum)
The swelling in your left leg appears to be caused from venous insufficiency.  This is a condition where the veins do not work as well as they should.  To treat it, use a compression hose/stocking, that goes from above the knee to the toes on the left.  This will help to push the fluid up and get it out.  Wear this during the day or when you are driving.  You can take it off at night to help be more comfortable.  We are referring you to a vein specialist to see if this treatment does not work.

## 2020-08-27 NOTE — ED Triage Notes (Signed)
Pt reports intermittent LE edema x 2 years. Pt presents today w/ left foot/leg swelling. States does not have PCP.  Recently started as a Naval architect.

## 2020-08-30 ENCOUNTER — Other Ambulatory Visit: Payer: Self-pay

## 2020-08-30 DIAGNOSIS — M25472 Effusion, left ankle: Secondary | ICD-10-CM
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
## 2020-09-26 ENCOUNTER — Ambulatory Visit (HOSPITAL_COMMUNITY)
Admission: RE | Admit: 2020-09-26 | Discharge: 2020-09-26 | Disposition: A | Discharge status: Home / Self Care | Payer: Self-pay | Source: Ambulatory Visit | Attending: Vascular Surgery | Admitting: Vascular Surgery

## 2020-09-26 ENCOUNTER — Other Ambulatory Visit: Payer: Self-pay

## 2020-09-26 ENCOUNTER — Ambulatory Visit (INDEPENDENT_AMBULATORY_CARE_PROVIDER_SITE_OTHER): Payer: Self-pay | Admitting: Vascular Surgery

## 2020-09-26 ENCOUNTER — Encounter: Payer: Self-pay | Admitting: Vascular Surgery

## 2020-09-26 VITALS — BP 126/83 | HR 74 | Temp 98.2°F | Resp 20 | Ht 75.0 in | Wt 265.0 lb

## 2020-09-26 DIAGNOSIS — M25472 Effusion, left ankle: Secondary | ICD-10-CM | POA: Insufficient documentation

## 2020-09-26 DIAGNOSIS — I890 Lymphedema, not elsewhere classified: Secondary | ICD-10-CM

## 2021-01-21 ENCOUNTER — Other Ambulatory Visit: Payer: Self-pay

## 2021-01-21 ENCOUNTER — Encounter (HOSPITAL_COMMUNITY): Payer: Self-pay

## 2021-01-21 ENCOUNTER — Ambulatory Visit (HOSPITAL_COMMUNITY)
Admission: EM | Admit: 2021-01-21 | Discharge: 2021-01-21 | Disposition: A | Discharge status: Home / Self Care | Payer: Self-pay | Attending: Physician Assistant | Admitting: Physician Assistant

## 2021-01-21 DIAGNOSIS — R369 Urethral discharge, unspecified: Secondary | ICD-10-CM | POA: Insufficient documentation

## 2021-01-21 DIAGNOSIS — N342 Other urethritis: Secondary | ICD-10-CM | POA: Insufficient documentation

## 2021-01-21 DIAGNOSIS — R030 Elevated blood-pressure reading, without diagnosis of hypertension: Secondary | ICD-10-CM | POA: Insufficient documentation

## 2021-01-21 DIAGNOSIS — Z711 Person with feared health complaint in whom no diagnosis is made: Secondary | ICD-10-CM | POA: Insufficient documentation

## 2021-01-21 DIAGNOSIS — Z113 Encounter for screening for infections with a predominantly sexual mode of transmission: Secondary | ICD-10-CM | POA: Insufficient documentation

## 2021-01-21 LAB — POCT URINALYSIS DIPSTICK, ED / UC
Bilirubin Urine: NEGATIVE
Glucose, UA: NEGATIVE mg/dL
Hgb urine dipstick: NEGATIVE
Ketones, ur: NEGATIVE mg/dL
Leukocytes,Ua: NEGATIVE
Nitrite: NEGATIVE
Protein, ur: NEGATIVE mg/dL
Specific Gravity, Urine: 1.025 (ref 1.005–1.030)
Urobilinogen, UA: 0.2 mg/dL (ref 0.0–1.0)
pH: 6 (ref 5.0–8.0)

## 2021-01-21 MED ORDER — DOXYCYCLINE HYCLATE 100 MG PO CAPS: 100.0000 mg | ORAL_CAPSULE | Freq: Two times a day (BID) | ORAL | 0 refills | Status: AC

## 2021-01-21 MED ORDER — CEFTRIAXONE SODIUM 500 MG IJ SOLR
INTRAMUSCULAR | Status: AC
  Filled 2021-01-21: qty 500

## 2021-01-21 MED ORDER — LIDOCAINE HCL (PF) 1 % IJ SOLN
INTRAMUSCULAR | Status: AC
  Filled 2021-01-21: qty 2

## 2021-01-21 MED ORDER — CEFTRIAXONE SODIUM 500 MG IJ SOLR
500.0000 mg | Freq: Once | INTRAMUSCULAR | Status: AC
  Administered 2021-01-21: 500 mg via INTRAMUSCULAR

## 2021-01-21 NOTE — ED Triage Notes (Signed)
Pt present for STI testing. He reports having some dysuria that started 3 days ago.
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
## 2021-01-21 NOTE — Discharge Instructions (Addendum)
We treated you for gonorrhea and chlamydia today.  We will contact you if we need to arrange any additional treatment based on your swab result.  Please abstain from sex for 1 week after you finish your treatment (a total of 2 weeks).  It is important that you use condoms with each sexual encounter.  If you have persistent or worsening symptoms please return for reevaluation.  Your blood pressure was elevated.  Please avoid caffeine, sodium, decongestants, NSAIDs (aspirin, ibuprofen/Advil, naproxen/Aleve).  Monitor your blood pressure at home and if this is persistently above 140/90 you should be reevaluated.  If you develop any chest pain, shortness of breath, vision changes, headache, dizziness in the setting of high blood pressure need to go to the emergency room.

## 2021-01-22 LAB — CYTOLOGY, (ORAL, ANAL, URETHRAL) ANCILLARY ONLY
Chlamydia: NEGATIVE
Comment: NEGATIVE
Comment: NEGATIVE
Comment: NORMAL
Neisseria Gonorrhea: NEGATIVE
Trichomonas: NEGATIVE

## 2021-01-24 ENCOUNTER — Ambulatory Visit: Payer: Self-pay | Admitting: Diagnostic Neuroimaging

## 2021-01-25 ENCOUNTER — Encounter: Payer: Self-pay | Admitting: Diagnostic Neuroimaging

## 2021-09-13 ENCOUNTER — Ambulatory Visit (HOSPITAL_COMMUNITY)
Admission: EM | Admit: 2021-09-13 | Discharge: 2021-09-13 | Disposition: A | Discharge status: Home / Self Care | Payer: Self-pay | Attending: Internal Medicine | Admitting: Internal Medicine

## 2021-09-13 ENCOUNTER — Encounter (HOSPITAL_COMMUNITY): Payer: Self-pay | Admitting: Emergency Medicine

## 2021-09-13 DIAGNOSIS — N489 Disorder of penis, unspecified: Secondary | ICD-10-CM | POA: Insufficient documentation

## 2021-09-13 LAB — POCT URINALYSIS DIPSTICK, ED / UC
Bilirubin Urine: NEGATIVE
Glucose, UA: NEGATIVE mg/dL
Hgb urine dipstick: NEGATIVE
Ketones, ur: NEGATIVE mg/dL
Leukocytes,Ua: NEGATIVE
Nitrite: NEGATIVE
Protein, ur: NEGATIVE mg/dL
Specific Gravity, Urine: 1.025 (ref 1.005–1.030)
Urobilinogen, UA: 0.2 mg/dL (ref 0.0–1.0)
pH: 5.5 (ref 5.0–8.0)

## 2021-09-13 MED ORDER — MUPIROCIN CALCIUM 2 % EX CREA: 1.0000 | TOPICAL_CREAM | Freq: Two times a day (BID) | CUTANEOUS | 0 refills | Status: DC

## 2021-09-13 NOTE — Discharge Instructions (Addendum)
Please abstain from sexual intercourse until lab results are available We will call you with recommendations if labs are abnormal Your urine is normal Return to urgent care if you have any other concerns.

## 2021-09-13 NOTE — ED Triage Notes (Signed)
Pt reports had a bump on penis that has been there for a while. Today the bump is very inflamed. Reports did have some discomfort when urinated.
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
## 2021-09-14 ENCOUNTER — Telehealth (HOSPITAL_COMMUNITY): Payer: Self-pay

## 2021-09-14 LAB — CYTOLOGY, (ORAL, ANAL, URETHRAL) ANCILLARY ONLY
Chlamydia: NEGATIVE
Comment: NEGATIVE
Comment: NEGATIVE
Comment: NORMAL
Neisseria Gonorrhea: NEGATIVE
Trichomonas: NEGATIVE

## 2021-09-14 LAB — HSV 1/2 PCR (SURFACE)
HSV-1 DNA: NOT DETECTED
HSV-2 DNA: NOT DETECTED

## 2021-09-14 MED ORDER — MUPIROCIN CALCIUM 2 % EX CREA: 1.0000 | TOPICAL_CREAM | Freq: Two times a day (BID) | CUTANEOUS | 0 refills | Status: AC

## 2021-09-14 MED ORDER — MUPIROCIN CALCIUM 2 % EX CREA: 1.0000 | TOPICAL_CREAM | Freq: Two times a day (BID) | CUTANEOUS | 0 refills | Status: DC

## 2022-01-06 IMAGING — CR DG ANKLE COMPLETE 3+V*L*
3 series · 3 of 3 positions shown · non-contrast
Comparison: LEFT foot and ankle from 3925 in 1616.

CLINICAL DATA: LEFT ankle swelling, worsened over the past week.

EXAM:
LEFT ANKLE COMPLETE - 3+ VIEW

[x ankle ap left]
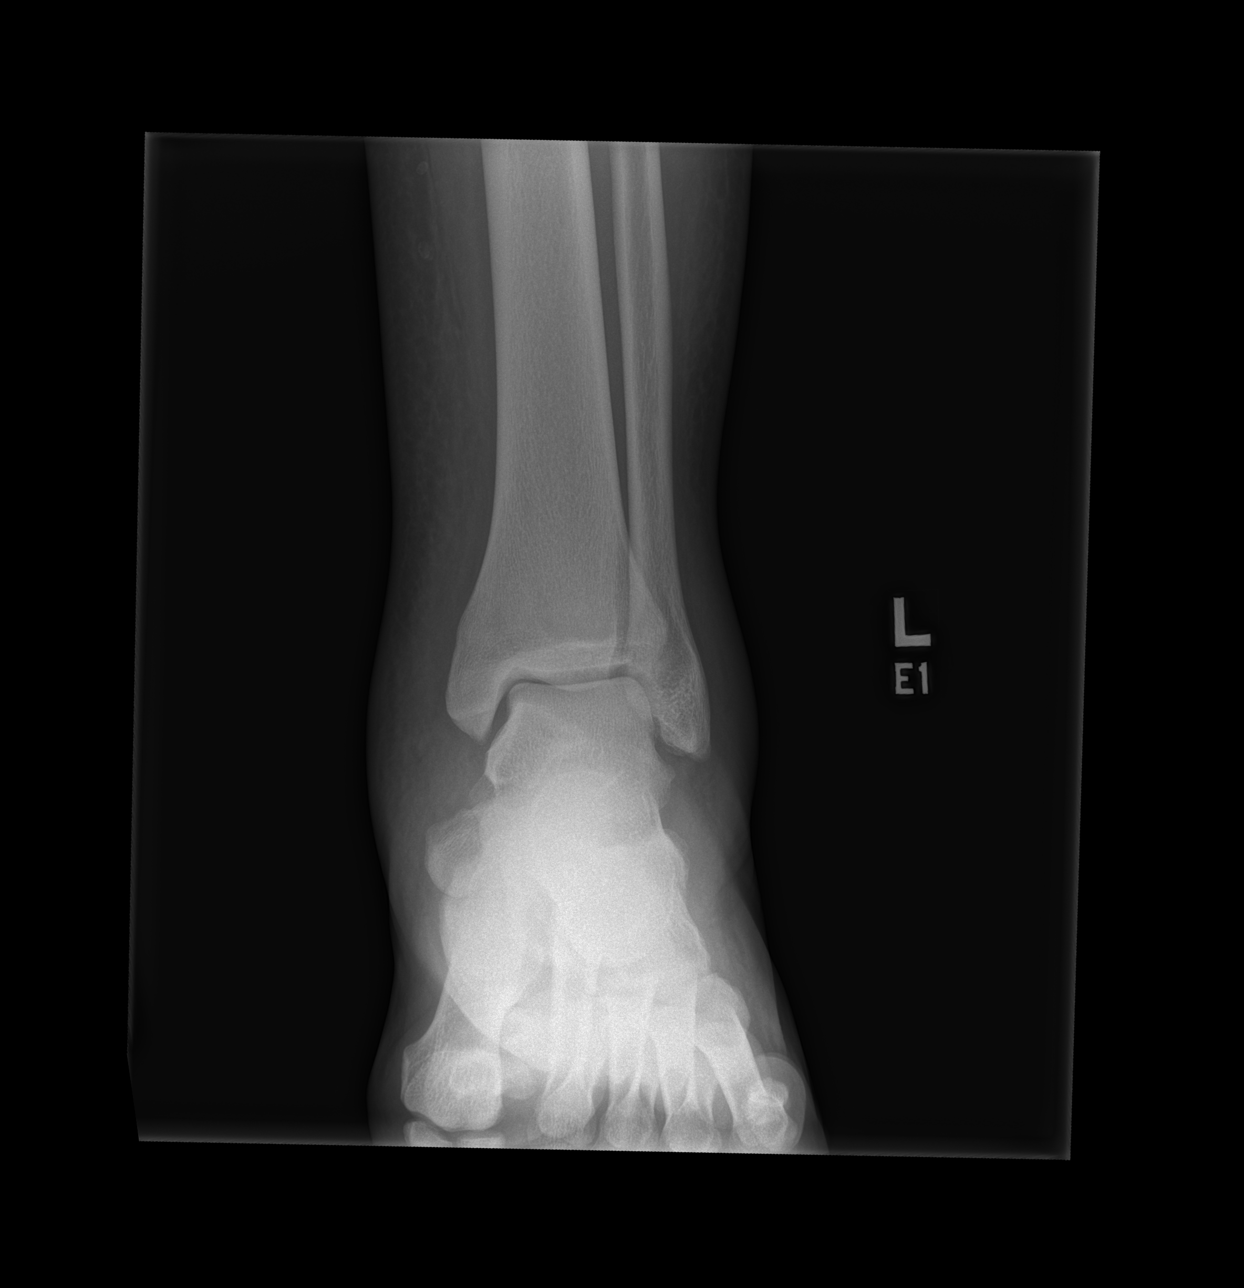

[x ankle obl left]
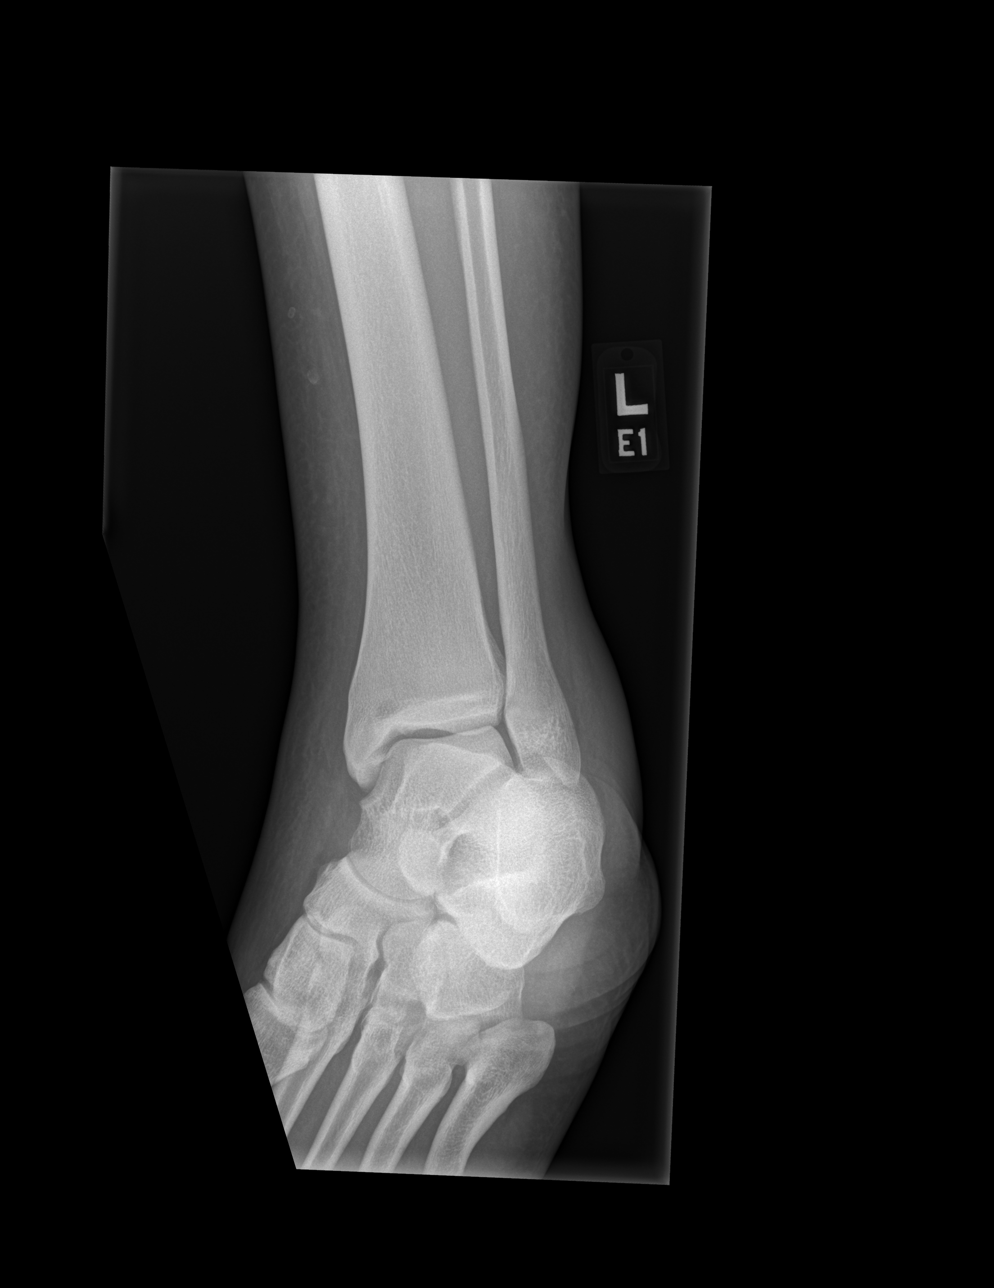

[x ankle lat left]
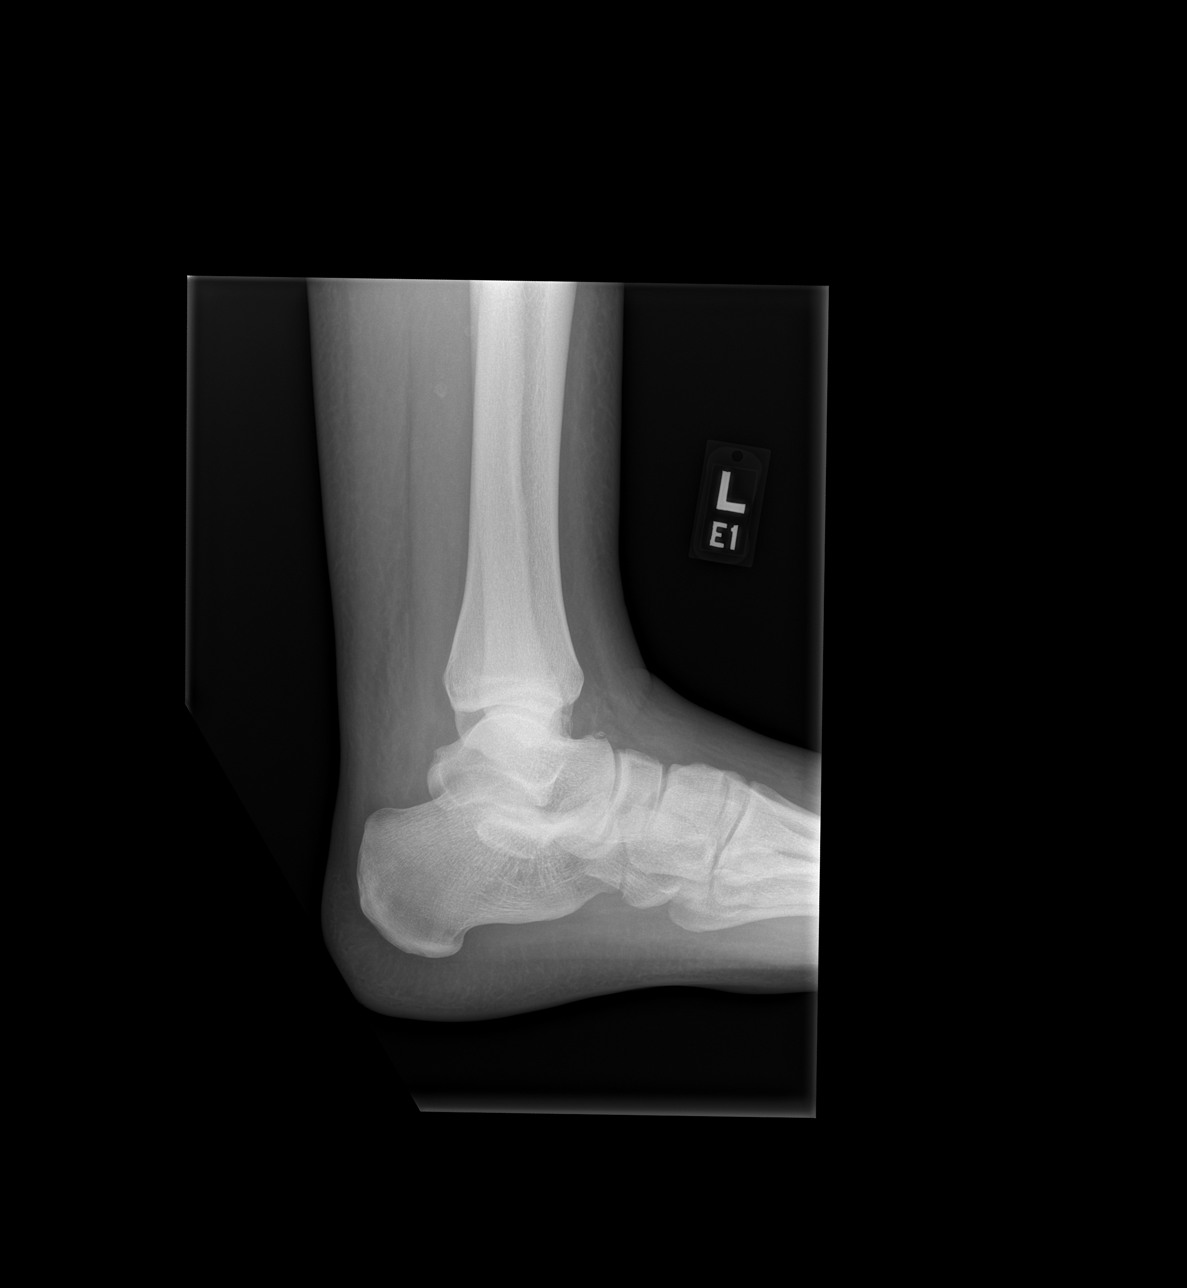

[3 of 3 positions shown; findings below may reference images not displayed]

FINDINGS: Generalized soft tissue swelling about the ankle. No sign of acute
fracture or dislocation. Ankle mortise is intact. Soft tissue
swelling may be greatest along the lateral malleolus and lateral
aspect of the lower leg.

Diffuse edema in the subcutaneous fat including Kager's fat pad.
This is similar to the study Sunday July, 2013. Chronic avulsion from
the anterior talus not changed since 1616.
IMPRESSION: 1. Generalized soft tissue swelling, no acute osseous abnormality.
2. Chronic avulsion from the anterior talus, no interval change
since [DATE].

## 2022-01-11 ENCOUNTER — Inpatient Hospital Stay (HOSPITAL_COMMUNITY)
Admission: EM | Admit: 2022-01-11 | Discharge: 2022-01-13 | DRG: 897 | Disposition: A | Discharge status: Home / Self Care | Payer: BLUE CROSS/BLUE SHIELD | Attending: Internal Medicine | Admitting: Internal Medicine

## 2022-01-11 ENCOUNTER — Encounter (HOSPITAL_COMMUNITY): Payer: Self-pay

## 2022-01-11 ENCOUNTER — Other Ambulatory Visit: Payer: Self-pay

## 2022-01-11 ENCOUNTER — Emergency Department (HOSPITAL_COMMUNITY): Payer: BLUE CROSS/BLUE SHIELD

## 2022-01-11 DIAGNOSIS — G9389 Other specified disorders of brain: Secondary | ICD-10-CM | POA: Diagnosis present

## 2022-01-11 DIAGNOSIS — Z87820 Personal history of traumatic brain injury: Secondary | ICD-10-CM

## 2022-01-11 DIAGNOSIS — F10131 Alcohol abuse with withdrawal delirium: Principal | ICD-10-CM | POA: Diagnosis present

## 2022-01-11 DIAGNOSIS — F1290 Cannabis use, unspecified, uncomplicated: Secondary | ICD-10-CM | POA: Diagnosis present

## 2022-01-11 DIAGNOSIS — R569 Unspecified convulsions: Secondary | ICD-10-CM | POA: Diagnosis not present

## 2022-01-11 DIAGNOSIS — F17290 Nicotine dependence, other tobacco product, uncomplicated: Secondary | ICD-10-CM | POA: Diagnosis present

## 2022-01-11 DIAGNOSIS — J45909 Unspecified asthma, uncomplicated: Secondary | ICD-10-CM | POA: Diagnosis present

## 2022-01-11 DIAGNOSIS — F1410 Cocaine abuse, uncomplicated: Secondary | ICD-10-CM | POA: Diagnosis present

## 2022-01-11 DIAGNOSIS — G4089 Other seizures: Secondary | ICD-10-CM | POA: Diagnosis present

## 2022-01-11 DIAGNOSIS — F19939 Other psychoactive substance use, unspecified with withdrawal, unspecified: Secondary | ICD-10-CM

## 2022-01-11 DIAGNOSIS — N1831 Chronic kidney disease, stage 3a: Secondary | ICD-10-CM | POA: Diagnosis present

## 2022-01-11 DIAGNOSIS — Z833 Family history of diabetes mellitus: Secondary | ICD-10-CM

## 2022-01-11 HISTORY — DX: Unspecified convulsions: R56.9

## 2022-01-11 LAB — CBC WITH DIFFERENTIAL/PLATELET
Abs Immature Granulocytes: 0.07 10*3/uL (ref 0.00–0.07)
Basophils Absolute: 0 10*3/uL (ref 0.0–0.1)
Basophils Relative: 0 %
Eosinophils Absolute: 0 10*3/uL (ref 0.0–0.5)
Eosinophils Relative: 0 %
HCT: 45.2 % (ref 39.0–52.0)
Hemoglobin: 15.9 g/dL (ref 13.0–17.0)
Immature Granulocytes: 1 %
Lymphocytes Relative: 10 %
Lymphs Abs: 1.4 10*3/uL (ref 0.7–4.0)
MCH: 31.2 pg (ref 26.0–34.0)
MCHC: 35.2 g/dL (ref 30.0–36.0)
MCV: 88.6 fL (ref 80.0–100.0)
Monocytes Absolute: 1.1 10*3/uL — ABNORMAL HIGH (ref 0.1–1.0)
Monocytes Relative: 7 %
Neutro Abs: 11.8 10*3/uL — ABNORMAL HIGH (ref 1.7–7.7)
Neutrophils Relative %: 82 %
Platelets: 223 10*3/uL (ref 150–400)
RBC: 5.1 MIL/uL (ref 4.22–5.81)
RDW: 13.2 % (ref 11.5–15.5)
WBC: 14.3 10*3/uL — ABNORMAL HIGH (ref 4.0–10.5)
nRBC: 0 % (ref 0.0–0.2)

## 2022-01-11 LAB — URINALYSIS, ROUTINE W REFLEX MICROSCOPIC
Bacteria, UA: NONE SEEN
Bilirubin Urine: NEGATIVE
Glucose, UA: NEGATIVE mg/dL
Ketones, ur: NEGATIVE mg/dL
Leukocytes,Ua: NEGATIVE
Nitrite: NEGATIVE
Protein, ur: 100 mg/dL — AB
Specific Gravity, Urine: 1.015 (ref 1.005–1.030)
pH: 5 (ref 5.0–8.0)

## 2022-01-11 LAB — COMPREHENSIVE METABOLIC PANEL
ALT: 45 U/L — ABNORMAL HIGH (ref 0–44)
AST: 37 U/L (ref 15–41)
Albumin: 4.1 g/dL (ref 3.5–5.0)
Alkaline Phosphatase: 59 U/L (ref 38–126)
Anion gap: 10 (ref 5–15)
BUN: 16 mg/dL (ref 6–20)
CO2: 23 mmol/L (ref 22–32)
Calcium: 8.9 mg/dL (ref 8.9–10.3)
Chloride: 103 mmol/L (ref 98–111)
Creatinine, Ser: 1.55 mg/dL — ABNORMAL HIGH (ref 0.61–1.24)
GFR, Estimated: >60 mL/min (ref >60)
Glucose, Bld: 72 mg/dL (ref 70–99)
Potassium: 4.8 mmol/L (ref 3.5–5.1)
Sodium: 136 mmol/L (ref 135–145)
Total Bilirubin: 0.5 mg/dL (ref 0.3–1.2)
Total Protein: 6.7 g/dL (ref 6.5–8.1)

## 2022-01-11 LAB — CK: Total CK: 514 U/L — ABNORMAL HIGH (ref 49–397)

## 2022-01-11 LAB — LACTIC ACID, PLASMA: Lactic Acid, Venous: 2.2 mmol/L (ref 0.5–1.9)

## 2022-01-11 LAB — CBG MONITORING, ED: Glucose-Capillary: 96 mg/dL (ref 70–99)

## 2022-01-11 LAB — ETHANOL: Alcohol, Ethyl (B): <10 mg/dL (ref <10)

## 2022-01-11 MED ORDER — SODIUM CHLORIDE 0.9 % IV BOLUS
1000.0000 mL | Freq: Once | INTRAVENOUS | Status: AC
  Administered 2022-01-11: 1000 mL via INTRAVENOUS

## 2022-01-11 MED ORDER — LEVETIRACETAM IN NACL 1000 MG/100ML IV SOLN
1000.0000 mg | Freq: Once | INTRAVENOUS | Status: AC
  Administered 2022-01-11: 1000 mg via INTRAVENOUS
  Filled 2022-01-11: qty 100

## 2022-01-11 MED ORDER — LORAZEPAM 2 MG/ML IJ SOLN
INTRAMUSCULAR | Status: AC
  Administered 2022-01-11: 2 mg
  Filled 2022-01-11: qty 1

## 2022-01-11 NOTE — ED Provider Notes (Signed)
[facility] DEPARTMENT Provider Note   CSN: [id number] Arrival date & time: 01/11/22  1747     History  Chief Complaint  Patient presents with   Seizures    Thomas Carter is a 27 y.o. male, history of TBI, presents today after a witnessed seizure.  Patient stated he was lying on the ground prone on his phone when all of a sudden he lost consciousness and woke up in the ambulance.  Friend stated that patient seized for 40 minutes and was exhibiting "jerky like movements." Friend said patient did not hit his head.  Patient bit the right side of his tongue and was postictal but did not lose bowel or bladder control.  Patient was observed to be shaking did not hit his head. Patient is not on any seizure medications at this time.  Patient has not seen neuro since 2021.    Patient states he has not used cocaine since his last seizure.  Patient endorsed history of drinking 16 ounces of moonshine every other day.  Patient worse to marijuana use daily.  Patient denied any chest pain or shortness of breath before episode.  Patient denied any abdominal pain, changes in sensation/motor skills, nausea vomiting, recent illnesses.  Home Medications Prior to Admission medications   Not on File      Allergies    Patient has no known allergies.    Review of Systems   Review of Systems  Neurological:  Positive for seizures.  See HPI  Physical Exam Updated Vital Signs BP (!) 116/53   Pulse 85   Temp 98.7 F (37.1 C) (Oral)   Resp (!) 22   Ht 6\' 3"  (1.905 m)   Wt (!) 138.3 kg   SpO2 90%   BMI 38.12 kg/m  Physical Exam HENT:     Head: Normocephalic and atraumatic.     Comments: No hemotympanum noted bilaterally No ocular ecchymosis or ecchymosis behind the ears on exam No crepitus palpated No signs of trauma noted    Right Ear: Tympanic membrane, ear canal and external ear normal.     Left Ear: Tympanic membrane, ear canal and external ear normal.     Nose:  Nose normal.     Mouth/Throat:     Mouth: Mucous membranes are moist.     Comments: Patient bit the right side of his tongue Eyes:     Extraocular Movements: Extraocular movements intact.     Conjunctiva/sclera: Conjunctivae normal.     Pupils: Pupils are equal, round, and reactive to light.  Neck:     Meningeal: Brudzinski's sign and Kernig's sign absent.     Comments: Patient noted left sided tenderness in the neck muscles.  Pain was exacerbated when he looked left or palpated.  Patient did not endorse any spinal tenderness.  Patient still have full range of motion in his neck without any gross abnormalities/signs of trauma. Cardiovascular:     Rate and Rhythm: Normal rate and regular rhythm.     Pulses: Normal pulses.     Heart sounds: Normal heart sounds.  Pulmonary:     Effort: Pulmonary effort is normal.     Breath sounds: Normal breath sounds.  Abdominal:     General: Abdomen is flat.     Palpations: Abdomen is soft.     Tenderness: There is no abdominal tenderness. There is no guarding.  Musculoskeletal:        General: Normal range of motion.  Cervical back: Normal range of motion. No edema, erythema or rigidity.  Skin:    General: Skin is warm and dry.     Capillary Refill: Capillary refill takes less than 2 seconds.     Findings: No bruising.  Neurological:     General: No focal deficit present.     Mental Status: He is alert and oriented to person, place, and time.     GCS: GCS eye subscore is 4. GCS verbal subscore is 5. GCS motor subscore is 6.     Cranial Nerves: Cranial nerves 2-12 are intact.     Sensory: Sensation is intact.     Motor: Motor function is intact.     Coordination: Coordination is intact.     Gait: Gait is intact.     Comments: Finger-to-nose test was normal Heel-to-shin test was normal Romberg was normal Patient able to bear weight  Psychiatric:        Mood and Affect: Mood normal.     ED Results / Procedures / Treatments    Labs (all labs ordered are listed, but only abnormal results are displayed) Labs Reviewed  COMPREHENSIVE METABOLIC PANEL - Abnormal; Notable for the following components:      Result Value   Creatinine, Ser 1.55 (*)    ALT 45 (*)    All other components within normal limits  CBC WITH DIFFERENTIAL/PLATELET - Abnormal; Notable for the following components:   WBC 14.3 (*)    Neutro Abs 11.8 (*)    Monocytes Absolute 1.1 (*)    All other components within normal limits  URINALYSIS, ROUTINE W REFLEX MICROSCOPIC - Abnormal; Notable for the following components:   Color, Urine STRAW (*)    Hgb urine dipstick SMALL (*)    Protein, ur 100 (*)    All other components within normal limits  CK - Abnormal; Notable for the following components:   Total CK 514 (*)    All other components within normal limits  ETHANOL  LACTIC ACID, PLASMA  LACTIC ACID, PLASMA  CBG MONITORING, ED    EKG EKG Interpretation  Date/Time:  Friday January 11 2022 18:04:41 EST Ventricular Rate:  89 PR Interval:  157 QRS Duration: 97 QT Interval:  355 QTC Calculation: 432 R Axis:   100 Text Interpretation: Sinus rhythm Borderline right axis deviation Confirmed by Zadie Rhine ([id number]) on 01/11/2022 11:09:39 PM  Radiology CT Head Wo Contrast  Result Date: 01/11/2022 CLINICAL DATA:  Seizure disorder EXAM: CT HEAD WITHOUT CONTRAST TECHNIQUE: Contiguous axial images were obtained from the base of the skull through the vertex without intravenous contrast. RADIATION DOSE REDUCTION: This exam was performed according to the departmental dose-optimization program which includes automated exposure control, adjustment of the mA and/or kV according to patient size and/or use of iterative reconstruction technique. COMPARISON:  05/26/2020 FINDINGS: Brain: Small focus of encephalomalacia within the anterior pole right temporal lobe is again noted, unchanged. No evidence of acute intracranial hemorrhage or infarct. No abnormal  mass effect or midline shift. No abnormal intra or extra-axial mass lesion or fluid collection. Ventricular size is normal. Cerebellum is unremarkable. Vascular: No hyperdense vessel or unexpected calcification. Skull: Normal. Negative for fracture or focal lesion. Sinuses/Orbits: No acute finding. Remote left medial orbital wall fracture again noted. Paranasal sinuses are clear Other: Mastoid air cells and middle ear cavities are clear. IMPRESSION: 1. No acute intracranial abnormality. 2. Stable small focus of encephalomalacia within the anterior pole right temporal lobe. Electronically Signed   By: Gloris Ham  Christa See M.D.   On: 01/11/2022 19:15    Procedures Procedures   Medications Ordered in ED Medications  sodium chloride 0.9 % bolus 1,000 mL (1,000 mLs Intravenous New Bag/Given 01/11/22 1933)  LORazepam (ATIVAN) 2 MG/ML injection (2 mg  Given 01/11/22 2300)    ED Course/ Medical Decision Making/ A&P Clinical Course as of 01/11/22 2317  Fri Jan 11, 2022  2003 CT Head Wo Contrast [AR]    Clinical Course User Index [AR] Benn Moulder                           Medical Decision Making Amount and/or Complexity of Data Reviewed Labs: ordered. Radiology: ordered.   Terrilee Croak 27 y.o. presented today for seizure. Working DDx that I considered at this time includes, but not limited to, TIA/stroke, seizure, hypoglycemia, electrolyte imbalance, drug-induced, UTI, sepsis, meningitis, rhabdomyolysis.  Review of prior external notes: 05/26/2020 ED  Unique Tests and Interpretation:  POCG: 96 Lactic Acid: Pending CK: Elevated 514 CMP: Unremarkable UA: unremarkable CBC: elevated WBC 14.3 Ethanol: Remarkable CT head without contrast: No acute changes noted; stable encephalomalacia  Discussion with Independent Historian: Friend via phone  Discussion of Management of Tests: None  Risk: Cannot be determined at this time  Risk Stratification Score: None  Staffed with Pati Gallo, PA-C  R/o DDx: TIA/CVA: CT was negative Meningitis: Negative Brudzinski's sign and Kernig's sign Electrolyte Imbalance/Hypoglycemia: labs are negative Drug-Induced: patient denied any new medications Alcohol withdrawal: patient does not exhibit typical withdrawal symptoms of tachycardia, N/V, diaphoresis UTI: UA was negative Rhabdomyolysis: CK was not elevated enough to suggest this is present.  Also in the CMP protein is not elevated.  Plan: Patient most likely had a seizure today due to presentation and history.  I suspect this is due to past TBI and his encephalomalacia. CK is likely elevated due to having a seizure earlier.  Patient will need to follow-up with neuro once discharged.  Patient was stable throughout encounter and during course in the ED however at 2300 patient began to seize and 2mg  Ativan was administered.  Neuro will be consulted by the oncoming team. Patient was signed out to oncoming team at 2316.  Final Clinical Impression(s) / ED Diagnoses Final diagnoses:  Seizure [facility])    Rx / [place] Orders ED Discharge Orders     None         Elvina Sidle 01/11/22 2318    Deno Etienne, DO 01/11/22 2328

## 2022-01-11 NOTE — ED Notes (Addendum)
This RN entered room due to pt being tachycardiac and hypoxic. Pt appeared diaphoretic, this RN attempted to speak to pt and pt pulled blanket over face. Pt then went unresponsive and sat up in bed shaking violently for approx. 45 seconds-66minute. MD to bedside during event. Blood coming from mouth during event, pt seemed to have bit tongue, suction provided during event.

## 2022-01-11 NOTE — ED Triage Notes (Signed)
Pt BIBGEMS for witnessed seizure, lasted one minute appeared to be grand mal. Family lowered patient to the ground, bite to tongue on right side bleeding controlled. Postictal for EMS then en route became alert and oriented.   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

## 2022-01-11 NOTE — ED Notes (Signed)
Order for 2mg  of ativan IM due to loss of IV during seizure like activity.

## 2022-01-12 ENCOUNTER — Inpatient Hospital Stay (HOSPITAL_COMMUNITY): Payer: BLUE CROSS/BLUE SHIELD

## 2022-01-12 ENCOUNTER — Other Ambulatory Visit (HOSPITAL_COMMUNITY): Payer: Self-pay

## 2022-01-12 DIAGNOSIS — J45909 Unspecified asthma, uncomplicated: Secondary | ICD-10-CM | POA: Diagnosis present

## 2022-01-12 DIAGNOSIS — F1410 Cocaine abuse, uncomplicated: Secondary | ICD-10-CM | POA: Diagnosis present

## 2022-01-12 DIAGNOSIS — F1290 Cannabis use, unspecified, uncomplicated: Secondary | ICD-10-CM | POA: Diagnosis present

## 2022-01-12 DIAGNOSIS — F1910 Other psychoactive substance abuse, uncomplicated: Secondary | ICD-10-CM | POA: Diagnosis not present

## 2022-01-12 DIAGNOSIS — Z833 Family history of diabetes mellitus: Secondary | ICD-10-CM | POA: Diagnosis not present

## 2022-01-12 DIAGNOSIS — G9389 Other specified disorders of brain: Secondary | ICD-10-CM | POA: Diagnosis present

## 2022-01-12 DIAGNOSIS — N1831 Chronic kidney disease, stage 3a: Secondary | ICD-10-CM | POA: Diagnosis present

## 2022-01-12 DIAGNOSIS — Z87820 Personal history of traumatic brain injury: Secondary | ICD-10-CM | POA: Diagnosis not present

## 2022-01-12 DIAGNOSIS — F19939 Other psychoactive substance use, unspecified with withdrawal, unspecified: Secondary | ICD-10-CM

## 2022-01-12 DIAGNOSIS — R569 Unspecified convulsions: Secondary | ICD-10-CM

## 2022-01-12 DIAGNOSIS — G4089 Other seizures: Secondary | ICD-10-CM | POA: Diagnosis present

## 2022-01-12 DIAGNOSIS — F17290 Nicotine dependence, other tobacco product, uncomplicated: Secondary | ICD-10-CM | POA: Diagnosis present

## 2022-01-12 DIAGNOSIS — F10131 Alcohol abuse with withdrawal delirium: Secondary | ICD-10-CM | POA: Diagnosis present

## 2022-01-12 LAB — COMPREHENSIVE METABOLIC PANEL
ALT: 42 U/L (ref 0–44)
AST: 33 U/L (ref 15–41)
Albumin: 3.8 g/dL (ref 3.5–5.0)
Alkaline Phosphatase: 54 U/L (ref 38–126)
Anion gap: 10 (ref 5–15)
BUN: 11 mg/dL (ref 6–20)
CO2: 21 mmol/L — ABNORMAL LOW (ref 22–32)
Calcium: 8.6 mg/dL — ABNORMAL LOW (ref 8.9–10.3)
Chloride: 105 mmol/L (ref 98–111)
Creatinine, Ser: 1.4 mg/dL — ABNORMAL HIGH (ref 0.61–1.24)
GFR, Estimated: >60 mL/min (ref >60)
Glucose, Bld: 108 mg/dL — ABNORMAL HIGH (ref 70–99)
Potassium: 4 mmol/L (ref 3.5–5.1)
Sodium: 136 mmol/L (ref 135–145)
Total Bilirubin: 0.4 mg/dL (ref 0.3–1.2)
Total Protein: 6.4 g/dL — ABNORMAL LOW (ref 6.5–8.1)

## 2022-01-12 LAB — LACTIC ACID, PLASMA: Lactic Acid, Venous: 1.6 mmol/L (ref 0.5–1.9)

## 2022-01-12 LAB — RAPID URINE DRUG SCREEN, HOSP PERFORMED
Amphetamines: NOT DETECTED
Barbiturates: NOT DETECTED
Benzodiazepines: NOT DETECTED
Cocaine: POSITIVE — AB
Opiates: NOT DETECTED
Tetrahydrocannabinol: POSITIVE — AB

## 2022-01-12 LAB — CBC
HCT: 42.4 % (ref 39.0–52.0)
Hemoglobin: 14.8 g/dL (ref 13.0–17.0)
MCH: 30.5 pg (ref 26.0–34.0)
MCHC: 34.9 g/dL (ref 30.0–36.0)
MCV: 87.4 fL (ref 80.0–100.0)
Platelets: 248 10*3/uL (ref 150–400)
RBC: 4.85 MIL/uL (ref 4.22–5.81)
RDW: 13.4 % (ref 11.5–15.5)
WBC: 11.4 10*3/uL — ABNORMAL HIGH (ref 4.0–10.5)
nRBC: 0 % (ref 0.0–0.2)

## 2022-01-12 LAB — HIV ANTIBODY (ROUTINE TESTING W REFLEX): HIV Screen 4th Generation wRfx: NONREACTIVE

## 2022-01-12 LAB — TSH: TSH: 0.499 u[IU]/mL (ref 0.350–4.500)

## 2022-01-12 MED ORDER — SODIUM CHLORIDE 0.9 % IV SOLN: INTRAVENOUS | Status: DC

## 2022-01-12 MED ORDER — CHLORDIAZEPOXIDE HCL 5 MG PO CAPS
15.0000 mg | ORAL_CAPSULE | Freq: Three times a day (TID) | ORAL | Status: DC
  Administered 2022-01-12 – 2022-01-13 (×3): 15 mg via ORAL
  Filled 2022-01-12 (×4): qty 3

## 2022-01-12 MED ORDER — ACETAMINOPHEN 650 MG RE SUPP: 650.0000 mg | Freq: Four times a day (QID) | RECTAL | Status: DC | PRN

## 2022-01-12 MED ORDER — LIDOCAINE VISCOUS HCL 2 % MT SOLN
15.0000 mL | OROMUCOSAL | Status: DC | PRN
  Administered 2022-01-12: 15 mL via OROMUCOSAL
  Filled 2022-01-12 (×2): qty 15

## 2022-01-12 MED ORDER — CHLORDIAZEPOXIDE HCL 5 MG PO CAPS
15.0000 mg | ORAL_CAPSULE | Freq: Three times a day (TID) | ORAL | Status: DC
  Filled 2022-01-12: qty 1

## 2022-01-12 MED ORDER — THIAMINE HCL 100 MG/ML IJ SOLN
100.0000 mg | Freq: Every day | INTRAMUSCULAR | Status: DC
  Administered 2022-01-12: 100 mg via INTRAVENOUS
  Filled 2022-01-12: qty 2

## 2022-01-12 MED ORDER — LACTATED RINGERS IV BOLUS
1000.0000 mL | Freq: Once | INTRAVENOUS | Status: AC
  Administered 2022-01-12: 1000 mL via INTRAVENOUS

## 2022-01-12 MED ORDER — LORAZEPAM 1 MG PO TABS: 0.0000 mg | ORAL_TABLET | Freq: Four times a day (QID) | ORAL | Status: DC

## 2022-01-12 MED ORDER — THIAMINE MONONITRATE 100 MG PO TABS
100.0000 mg | ORAL_TABLET | Freq: Every day | ORAL | Status: DC
  Administered 2022-01-13: 100 mg via ORAL
  Filled 2022-01-12: qty 1

## 2022-01-12 MED ORDER — ONDANSETRON HCL 4 MG PO TABS: 4.0000 mg | ORAL_TABLET | Freq: Four times a day (QID) | ORAL | Status: DC | PRN

## 2022-01-12 MED ORDER — ALBUTEROL SULFATE (2.5 MG/3ML) 0.083% IN NEBU: 2.5000 mg | INHALATION_SOLUTION | RESPIRATORY_TRACT | Status: DC | PRN

## 2022-01-12 MED ORDER — ONDANSETRON HCL 4 MG/2ML IJ SOLN: 4.0000 mg | Freq: Four times a day (QID) | INTRAMUSCULAR | Status: DC | PRN

## 2022-01-12 MED ORDER — POTASSIUM CL IN DEXTROSE 5% 20 MEQ/L IV SOLN
20.0000 meq | INTRAVENOUS | Status: AC
  Administered 2022-01-12 – 2022-01-13 (×3): 20 meq via INTRAVENOUS
  Filled 2022-01-12 (×2): qty 1000

## 2022-01-12 MED ORDER — LEVETIRACETAM IN NACL 1000 MG/100ML IV SOLN
1000.0000 mg | Freq: Once | INTRAVENOUS | Status: AC
  Administered 2022-01-12: 1000 mg via INTRAVENOUS
  Filled 2022-01-12: qty 100

## 2022-01-12 MED ORDER — HEPARIN SODIUM (PORCINE) 5000 UNIT/ML IJ SOLN
5000.0000 [IU] | Freq: Three times a day (TID) | INTRAMUSCULAR | Status: DC
  Administered 2022-01-12 (×3): 5000 [IU] via SUBCUTANEOUS
  Filled 2022-01-12 (×3): qty 1

## 2022-01-12 MED ORDER — PANTOPRAZOLE SODIUM 40 MG PO TBEC
40.0000 mg | DELAYED_RELEASE_TABLET | Freq: Every day | ORAL | Status: DC
  Administered 2022-01-12 – 2022-01-13 (×2): 40 mg via ORAL
  Filled 2022-01-12 (×2): qty 1

## 2022-01-12 MED ORDER — ACETAMINOPHEN 325 MG PO TABS
650.0000 mg | ORAL_TABLET | Freq: Four times a day (QID) | ORAL | Status: DC | PRN
  Administered 2022-01-12 (×2): 650 mg via ORAL
  Filled 2022-01-12 (×2): qty 2

## 2022-01-12 MED ORDER — LORAZEPAM 1 MG PO TABS
2.0000 mg | ORAL_TABLET | Freq: Four times a day (QID) | ORAL | Status: DC
  Administered 2022-01-12 (×2): 2 mg via ORAL
  Filled 2022-01-12 (×2): qty 2

## 2022-01-12 NOTE — Procedures (Signed)
Routine EEG Report  Thomas Carter is a 27 y.o. male with a history of seizure who is undergoing an EEG to evaluate for seizures.  Report: This EEG was acquired with electrodes placed according to the International 10-20 electrode system (including Fp1, Fp2, F3, F4, C3, C4, P3, P4, O1, O2, T3, T4, T5, T6, A1, A2, Fz, Cz, Pz). The following electrodes were missing or displaced: none.  The occipital dominant rhythm was 8.5 Hz with overriding beta frequencies. This activity is reactive to stimulation. Drowsiness was manifested by background fragmentation; deeper stages of sleep were identified by K complexes and sleep spindles. There was no focal slowing. There were no interictal epileptiform discharges. There were no electrographic seizures identified. Photic stimulation and hyperventilation were not performed.  Impression: This EEG was obtained while awake and asleep and is normal.    Clinical Correlation: Normal EEGs, however, do not rule out epilepsy.  Su Monks, MD [facility] [phone]  If 7pm- 7am, please page neurology on call as listed in [place].

## 2022-01-12 NOTE — Progress Notes (Signed)
PROGRESS NOTE                                                                                                                                                                                                             Patient Demographics:    Thomas Carter, is a 27 y.o. male, DOB - 08-03-1995, ZOX:[id number]  Outpatient Primary MD for the patient is Pcp, No    LOS - 0  Admit date - 01/11/2022    Chief Complaint  Patient presents with   Seizures       Brief Narrative (HPI from H&P)   27 y.o. male with medical history significant of Asthma, history of seizure episode in setting of cocaine use, TBI who presents to ED BIB EMS s/p witnessed tonic clonic seizure that lasted for one minute. In the field per notes patient was noted to have bite to tongue  with bleeding s/p sz.  Currently he drinks a lot of alcohol however for the last couple of days was not able to drink and likely got alcohol withdrawal seizure and was brought to the hospital.  In the ER was seen by neurology and [facility] was requested to admit the patient.   Subjective:    Thomas Carter today has, No headache, No chest pain, No abdominal pain - No Nausea, No new weakness tingling or numbness, no shortness of breath   Assessment  & Plan :   Alcohol withdrawal seizure in a patient with TBI, ongoing alcohol and cocaine abuse. He has been seen by neurology, received Ativan and Keppra in the ER, head CT nonacute, no focal deficits, currently in DTs and has received some Ativan this morning as well.  Per neurology no continued antiseizure medications.  He has been strictly counseled to abstain from alcohol and cocaine.  Continue to monitor on Librium along with CIWA protocol.   CKD stage IIIa.  Baseline creatinine 1.5.  At baseline monitor.  History of asthma.  No acute issues supportive care.       Condition - Fair  Family Communication  : None present  Code Status :   Full  Consults  :  None  PUD Prophylaxis : PPI   Procedures  :      CT -  1. No acute intracranial abnormality. 2. Stable small focus of encephalomalacia within the anterior  pole right temporal lobe.       Disposition Plan  :    Status is: Inpatient   DVT Prophylaxis  :    heparin injection 5,000 Units Start: 01/12/22 0600    Lab Results  Component Value Date   PLT 248 01/12/2022    Diet :  Diet Order             Diet regular Room service appropriate? Yes; Fluid consistency: Thin  Diet effective now                    Inpatient Medications  Scheduled Meds:  chlordiazePOXIDE  15 mg Oral TID   heparin  5,000 Units Subcutaneous Q8H   LORazepam  0-4 mg Oral Q6H   thiamine  100 mg Oral Daily   Or   thiamine  100 mg Intravenous Daily   Continuous Infusions:  dextrose 5 % with KCl 20 mEq / L     PRN Meds:.acetaminophen **OR** acetaminophen, albuterol, ondansetron **OR** ondansetron (ZOFRAN) IV  Antibiotics  :    Anti-infectives (From admission, onward)    None         Objective:   Vitals:   01/12/22 0245 01/12/22 0430 01/12/22 0442 01/12/22 0600  BP: 127/61 108/67  111/69  Pulse: 91 96  96  Resp: (!) 21 15  16   Temp:   98.5 F (36.9 C) 98.6 F (37 C)  TempSrc:   Oral Oral  SpO2: 99% 99%  99%  Weight:      Height:        Wt Readings from Last 3 Encounters:  01/11/22 (!) 138.3 kg  09/26/20 120.2 kg  08/27/20 124.7 kg     Intake/Output Summary (Last 24 hours) at 01/12/2022 0836 Last data filed at 01/12/2022 0817 Gross per 24 hour  Intake --  Output 800 ml  Net -800 ml     Physical Exam  Somnolent but arousable, No new F.N deficits, Normal affect [place].AT,PERRAL Supple Neck, No JVD,   Symmetrical Chest wall movement, Good air movement bilaterally, CTAB RRR,No Gallops,Rubs or new Murmurs,  +ve B.Sounds, Abd Soft, No tenderness,   No Cyanosis, Clubbing or edema     Data Review:    Recent Labs  Lab 01/11/22 1932 01/12/22 0434   WBC 14.3* 11.4*  HGB 15.9 14.8  HCT 45.2 42.4  PLT 223 248  MCV 88.6 87.4  MCH 31.2 30.5  MCHC 35.2 34.9  RDW 13.2 13.4  LYMPHSABS 1.4  --   MONOABS 1.1*  --   EOSABS 0.0  --   BASOSABS 0.0  --     Recent Labs  Lab 01/11/22 1932 01/11/22 2043 01/12/22 0302 01/12/22 0434  NA 136  --   --  136  K 4.8  --   --  4.0  CL 103  --   --  105  CO2 23  --   --  21*  ANIONGAP 10  --   --  10  GLUCOSE 72  --   --  108*  BUN 16  --   --  11  CREATININE 1.55*  --   --  1.40*  AST 37  --   --  33  ALT 45*  --   --  42  ALKPHOS 59  --   --  54  BILITOT 0.5  --   --  0.4  ALBUMIN 4.1  --   --  3.8  LATICACIDVEN  --  2.2*  1.6  --   TSH  --   --   --  0.499  CALCIUM 8.9  --   --  8.6*    Radiology Reports CT Head Wo Contrast  Result Date: 01/11/2022 CLINICAL DATA:  Seizure disorder EXAM: CT HEAD WITHOUT CONTRAST TECHNIQUE: Contiguous axial images were obtained from the base of the skull through the vertex without intravenous contrast. RADIATION DOSE REDUCTION: This exam was performed according to the departmental dose-optimization program which includes automated exposure control, adjustment of the mA and/or kV according to patient size and/or use of iterative reconstruction technique. COMPARISON:  05/26/2020 FINDINGS: Brain: Small focus of encephalomalacia within the anterior pole right temporal lobe is again noted, unchanged. No evidence of acute intracranial hemorrhage or infarct. No abnormal mass effect or midline shift. No abnormal intra or extra-axial mass lesion or fluid collection. Ventricular size is normal. Cerebellum is unremarkable. Vascular: No hyperdense vessel or unexpected calcification. Skull: Normal. Negative for fracture or focal lesion. Sinuses/Orbits: No acute finding. Remote left medial orbital wall fracture again noted. Paranasal sinuses are clear Other: Mastoid air cells and middle ear cavities are clear. IMPRESSION: 1. No acute intracranial abnormality. 2. Stable small  focus of encephalomalacia within the anterior pole right temporal lobe. Electronically Signed   By: Helyn Numbers M.D.   On: 01/11/2022 19:15      Signature  -   Susa Raring M.D on 01/12/2022 at 8:36 AM   -  To page go to [email or web address]

## 2022-01-12 NOTE — Progress Notes (Signed)
Unable to complete at this time. EEG software is experiencing technical difficulties.
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
## 2022-01-12 NOTE — ED Provider Notes (Signed)
Accepted handoff at shift change from prior PA-C. Please see prior provider note for more detail.   Briefly: Patient is 27 y.o.    Plan: Discussed with neurology and ultimately admitted for multiple seizures in the setting of possible alcohol withdrawal     Physical Exam  BP 114/60   Pulse 93   Temp 98.7 F (37.1 C) (Oral)   Resp (!) 25   Ht 6\' 3"  (1.905 m)   Wt (!) 138.3 kg   SpO2 92%   BMI 38.12 kg/m   Physical Exam  Procedures  Procedures  ED Course / MDM   Clinical Course as of 01/12/22 0249  Fri Jan 11, 2022  2003 CT Head Wo Contrast [AR]  Sat Jan 12, 2022  0054 Driving precautions, CIWA protocol.  2g total load. Routine EEG tonight.  [WF]    Clinical Course User Index [AR] Skeet Simmer, Student-PA [WF] Tedd Sias, [place]   Medical Decision Making Amount and/or Complexity of Data Reviewed Labs: ordered. Radiology: ordered.  Risk OTC drugs. Prescription drug management. Decision regarding hospitalization.   Patient here with 2 seizures today.  Seems to be passed to baseline on my reevaluation after second seizure patient was diaphoretic and tremulous.  Given some Ativan with some improvement in heart rate and tremulousness and anxiety.  Briefly discussed with neurology Dr. Raliegh Ip who recommends routine EEG tonight which he will order, 2 g of Keppra load but does not recommend continued Keppra  I will admit to the hospitalist service.  Dr. Marcello Moores will admit patient.       Pati Gallo [place], [place] 01/12/22 0340    Ripley Fraise, MD 01/12/22 [phone]

## 2022-01-12 NOTE — ED Notes (Signed)
Pt lethargic, responds to voice and oriented x 4.

## 2022-01-12 NOTE — Progress Notes (Addendum)
EEG complete - results pending 

## 2022-01-13 LAB — BRAIN NATRIURETIC PEPTIDE: B Natriuretic Peptide: 42.1 pg/mL (ref 0.0–100.0)

## 2022-01-13 LAB — CBC WITH DIFFERENTIAL/PLATELET
Abs Immature Granulocytes: 0.01 10*3/uL (ref 0.00–0.07)
Basophils Absolute: 0 10*3/uL (ref 0.0–0.1)
Basophils Relative: 0 %
Eosinophils Absolute: 0 10*3/uL (ref 0.0–0.5)
Eosinophils Relative: 1 %
HCT: 40.7 % (ref 39.0–52.0)
Hemoglobin: 14.1 g/dL (ref 13.0–17.0)
Immature Granulocytes: 0 %
Lymphocytes Relative: 38 %
Lymphs Abs: 2.3 10*3/uL (ref 0.7–4.0)
MCH: 30.8 pg (ref 26.0–34.0)
MCHC: 34.6 g/dL (ref 30.0–36.0)
MCV: 88.9 fL (ref 80.0–100.0)
Monocytes Absolute: 0.7 10*3/uL (ref 0.1–1.0)
Monocytes Relative: 11 %
Neutro Abs: 3 10*3/uL (ref 1.7–7.7)
Neutrophils Relative %: 50 %
Platelets: 219 10*3/uL (ref 150–400)
RBC: 4.58 MIL/uL (ref 4.22–5.81)
RDW: 13.4 % (ref 11.5–15.5)
WBC: 6 10*3/uL (ref 4.0–10.5)
nRBC: 0 % (ref 0.0–0.2)

## 2022-01-13 LAB — BASIC METABOLIC PANEL
Anion gap: 10 (ref 5–15)
BUN: 10 mg/dL (ref 6–20)
CO2: 24 mmol/L (ref 22–32)
Calcium: 8.7 mg/dL — ABNORMAL LOW (ref 8.9–10.3)
Chloride: 103 mmol/L (ref 98–111)
Creatinine, Ser: 1.31 mg/dL — ABNORMAL HIGH (ref 0.61–1.24)
GFR, Estimated: >60 mL/min (ref >60)
Glucose, Bld: 105 mg/dL — ABNORMAL HIGH (ref 70–99)
Potassium: 3.8 mmol/L (ref 3.5–5.1)
Sodium: 137 mmol/L (ref 135–145)

## 2022-01-13 LAB — MAGNESIUM: Magnesium: 2 mg/dL (ref 1.7–2.4)

## 2022-01-13 MED ORDER — CHLORDIAZEPOXIDE HCL 5 MG PO CAPS: ORAL_CAPSULE | ORAL | 0 refills | Status: DC

## 2022-01-13 MED ORDER — CHLORDIAZEPOXIDE HCL 5 MG PO CAPS: 10.0000 mg | ORAL_CAPSULE | Freq: Three times a day (TID) | ORAL | Status: DC

## 2022-01-13 MED ORDER — FOLIC ACID 1 MG PO TABS: 1.0000 mg | ORAL_TABLET | Freq: Every day | ORAL | 0 refills | Status: DC

## 2022-01-13 MED ORDER — VITAMIN B-1 100 MG PO TABS: 100.0000 mg | ORAL_TABLET | Freq: Every day | ORAL | 0 refills | Status: DC

## 2022-01-13 NOTE — Discharge Summary (Signed)
Thomas Carter NIO:[id number] DOB: 06/08/1995 DOA: 01/11/2022  PCP: Pcp, No  Admit date: 01/11/2022  Discharge date: 01/13/2022  Admitted From: Home   Disposition:  Home   Recommendations for Outpatient Follow-up:   Follow up with PCP in 1-2 weeks  PCP Please obtain BMP/CBC, 2 view CXR in 1week,  (see Discharge instructions)   PCP Please follow up on the following pending results:    Home Health: None   Equipment/Devices: None  Consultations: Neuro Discharge Condition: Stable    CODE STATUS: Full    Diet Recommendation: Heart Healthy   Diet Order             Diet - low sodium heart healthy           Diet regular Room service appropriate? Yes; Fluid consistency: Thin  Diet effective now                    Chief Complaint  Patient presents with   Seizures     Brief history of present illness from the day of admission and additional interim summary     27 y.o. male with medical history significant of Asthma, history of seizure episode in setting of cocaine use, TBI who presents to ED BIB EMS s/p witnessed tonic clonic seizure that lasted for one minute. In the field per notes patient was noted to have bite to tongue  with bleeding s/p sz.  Currently he drinks a lot of alcohol however for the last couple of days was not able to drink and likely got alcohol withdrawal seizure and was brought to the hospital.  In the ER was seen by neurology and [facility] was requested to admit the patient.                                                                  Hospital Course   Alcohol withdrawal seizure in a patient with TBI, ongoing alcohol and cocaine abuse. He has been seen by neurology, received Ativan and Keppra in the ER, head CT & EEG nonacute, no focal deficits, Per neurology no need for continued antiseizure  medications.  He has been strictly counseled to abstain from alcohol and cocaine, much stable today will be discharged home on a Librium taper.  Strictly counseled to quit alcohol.  Requested not to drive till cleared by neurology return instructions provided.  CKD stage IIIa.  Baseline creatinine 1.5, better than baseline upon hydration.  PCP to monitor.   History of asthma.  No acute issues supportive care.    Discharge diagnosis     Principal Problem:   Withdrawal seizures [facility])    Discharge instructions    Discharge Instructions     Diet - low sodium heart healthy   Complete by: As directed  Discharge instructions   Complete by: As directed    Do not drive, operate heavy machinery, perform activities at heights, swimming or participation in water activities or provide baby sitting services if your were admitted for syncope or siezures until you have seen by Primary MD or a Neurologist and advised to do so again.   Follow with Primary MD in 7 days   Get CBC, CMP, 2 view Chest X ray -  checked next visit with your primary MD or SNF MD ( we routinely change or add medications that can affect your baseline labs and fluid status, therefore we recommend that you get the mentioned basic workup next visit with your PCP, your PCP may decide not to get them or add new tests based on their clinical decision)  Activity: As tolerated with Full fall precautions use walker/cane & assistance as needed  Disposition Home    Diet: Heart Healthy   Special Instructions: If you have smoked or chewed Tobacco  in the last 2 yrs please stop smoking, stop any regular Alcohol  and or any Recreational drug use.  On your next visit with your primary care physician please Get Medicines reviewed and adjusted.  Please request your Prim.MD to go over all Hospital Tests and Procedure/Radiological results at the follow up, please get all Hospital records sent to your Prim MD by signing hospital release  before you go home.  If you experience worsening of your admission symptoms, develop shortness of breath, life threatening emergency, suicidal or homicidal thoughts you must seek medical attention immediately by calling 911 or calling your MD immediately  if symptoms less severe.  You Must read complete instructions/literature along with all the possible adverse reactions/side effects for all the Medicines you take and that have been prescribed to you. Take any new Medicines after you have completely understood and accpet all the possible adverse reactions/side effects.   Do not drive, operate heavy machinery, perform activities at heights, swimming or participation in water activities or provide baby sitting services if your were admitted for syncope or siezures until you have seen by Primary MD or a Neurologist and advised to do so again.   Increase activity slowly   Complete by: As directed        Discharge Medications   Allergies as of 01/13/2022   No Known Allergies      Medication List     TAKE these medications    chlordiazePOXIDE 5 MG capsule Commonly known as: LIBRIUM Please dispense 18 pills - Take 1 pill three times a day for 3 days, then Take 1 pill two times a day for 3 days, then Take 1 pill once a day for 3 days and stop.   folic acid 1 MG tablet Commonly known as: FOLVITE Take 1 tablet (1 mg total) by mouth daily.   thiamine 100 MG tablet Commonly known as: Vitamin B-1 Take 1 tablet (100 mg total) by mouth daily. Start taking on: January 14, 2022         Follow-up Information      [facility]. Schedule an appointment as soon as possible for a visit in 1 week(s).   Contact information: [address] [place] [address] [phone]                Major procedures and Radiology Reports - PLEASE review detailed and final reports thoroughly  -      EEG adult  Result Date:  01/12/2022 Jefferson Fuel, MD     01/12/2022  6:23 PM Routine EEG Report Thomas Carter is a 27 y.o. male with a history of seizure who is undergoing an EEG to evaluate for seizures. Report: This EEG was acquired with electrodes placed according to the International 10-20 electrode system (including Fp1, Fp2, F3, F4, C3, C4, P3, P4, O1, O2, T3, T4, T5, T6, A1, A2, Fz, Cz, Pz). The following electrodes were missing or displaced: none. The occipital dominant rhythm was 8.5 Hz with overriding beta frequencies. This activity is reactive to stimulation. Drowsiness was manifested by background fragmentation; deeper stages of sleep were identified by K complexes and sleep spindles. There was no focal slowing. There were no interictal epileptiform discharges. There were no electrographic seizures identified. Photic stimulation and hyperventilation were not performed. Impression: This EEG was obtained while awake and asleep and is normal.   Clinical Correlation: Normal EEGs, however, do not rule out epilepsy. Bing Neighbors, MD [facility] [phone] If 7pm- 7am, please page neurology on call as listed in AMION.   CT Head Wo Contrast  Result Date: 01/11/2022 CLINICAL DATA:  Seizure disorder EXAM: CT HEAD WITHOUT CONTRAST TECHNIQUE: Contiguous axial images were obtained from the base of the skull through the vertex without intravenous contrast. RADIATION DOSE REDUCTION: This exam was performed according to the departmental dose-optimization program which includes automated exposure control, adjustment of the mA and/or kV according to patient size and/or use of iterative reconstruction technique. COMPARISON:  05/26/2020 FINDINGS: Brain: Small focus of encephalomalacia within the anterior pole right temporal lobe is again noted, unchanged. No evidence of acute intracranial hemorrhage or infarct. No abnormal mass effect or midline shift. No abnormal intra or extra-axial mass lesion or fluid collection. Ventricular  size is normal. Cerebellum is unremarkable. Vascular: No hyperdense vessel or unexpected calcification. Skull: Normal. Negative for fracture or focal lesion. Sinuses/Orbits: No acute finding. Remote left medial orbital wall fracture again noted. Paranasal sinuses are clear Other: Mastoid air cells and middle ear cavities are clear. IMPRESSION: 1. No acute intracranial abnormality. 2. Stable small focus of encephalomalacia within the anterior pole right temporal lobe. Electronically Signed   By: Helyn Numbers M.D.   On: 01/11/2022 19:15    Micro Results    No results found for this or any previous visit (from the past 240 hour(s)).  Today   Subjective    Thomas Carter today has no headache,no chest abdominal pain,no new weakness tingling or numbness, feels much better wants to go home today.    Objective   Blood pressure 121/83, pulse 77, temperature 98 F (36.7 C), resp. rate 18, height 6\' 3"  (1.905 m), weight (!) 138.3 kg, SpO2 98 %.   Intake/Output Summary (Last 24 hours) at 01/13/2022 0938 Last data filed at 01/13/2022 0000 Gross per 24 hour  Intake 939.35 ml  Output --  Net 939.35 ml    Exam  Awake Alert, No new F.N deficits,    [place].AT,PERRAL Supple Neck,   Symmetrical Chest wall movement, Good air movement bilaterally, CTAB RRR,No Gallops,   +ve B.Sounds, Abd Soft, Non tender,  No Cyanosis, Clubbing or edema    Data Review   Recent Labs  Lab 01/11/22 1932 01/12/22 0434 01/13/22 0250  WBC 14.3* 11.4* 6.0  HGB 15.9 14.8 14.1  HCT 45.2 42.4 40.7  PLT 223 248 219  MCV 88.6 87.4 88.9  MCH 31.2 30.5 30.8  MCHC 35.2 34.9 34.6  RDW 13.2 13.4 13.4  LYMPHSABS 1.4  --  2.3  MONOABS 1.1*  --  0.7  EOSABS 0.0  --  0.0  BASOSABS 0.0  --  0.0    Recent Labs  Lab 01/11/22 1932 01/11/22 2043 01/12/22 0302 01/12/22 0434 01/13/22 0250 01/13/22 0251  NA 136  --   --  136 137  --   K 4.8  --   --  4.0 3.8  --   CL 103  --   --  105 103  --   CO2 23  --   --  21* 24  --    ANIONGAP 10  --   --  10 10  --   GLUCOSE 72  --   --  108* 105*  --   BUN 16  --   --  11 10  --   CREATININE 1.55*  --   --  1.40* 1.31*  --   AST 37  --   --  33  --   --   ALT 45*  --   --  42  --   --   ALKPHOS 59  --   --  54  --   --   BILITOT 0.5  --   --  0.4  --   --   ALBUMIN 4.1  --   --  3.8  --   --   LATICACIDVEN  --  2.2* 1.6  --   --   --   TSH  --   --   --  0.499  --   --   BNP  --   --   --   --   --  42.1  MG  --   --   --   --  2.0  --   CALCIUM 8.9  --   --  8.6* 8.7*  --      Total Time in preparing paper work, data evaluation and todays exam - 35 minutes  Signature  -    Susa Raring M.D on 01/13/2022 at 9:38 AM   -  To page go to [email or web address]

## 2022-01-13 NOTE — Discharge Instructions (Signed)
Do not drive, operate heavy machinery, perform activities at heights, swimming or participation in water activities or provide baby sitting services if your were admitted for syncope or siezures until you have seen by Primary MD or a Neurologist and advised to do so again.   Follow with Primary MD in 7 days   Get CBC, CMP, 2 view Chest X ray -  checked next visit with your primary MD or SNF MD ( we routinely change or add medications that can affect your baseline labs and fluid status, therefore we recommend that you get the mentioned basic workup next visit with your PCP, your PCP may decide not to get them or add new tests based on their clinical decision)  Activity: As tolerated with Full fall precautions use walker/cane & assistance as needed  Disposition Home    Diet: Heart Healthy   Special Instructions: If you have smoked or chewed Tobacco  in the last 2 yrs please stop smoking, stop any regular Alcohol  and or any Recreational drug use.  On your next visit with your primary care physician please Get Medicines reviewed and adjusted.  Please request your Prim.MD to go over all Hospital Tests and Procedure/Radiological results at the follow up, please get all Hospital records sent to your Prim MD by signing hospital release before you go home.  If you experience worsening of your admission symptoms, develop shortness of breath, life threatening emergency, suicidal or homicidal thoughts you must seek medical attention immediately by calling 911 or calling your MD immediately  if symptoms less severe.  You Must read complete instructions/literature along with all the possible adverse reactions/side effects for all the Medicines you take and that have been prescribed to you. Take any new Medicines after you have completely understood and accpet all the possible adverse reactions/side effects.   Do not drive, operate heavy machinery, perform activities at heights, swimming or participation in  water activities or provide baby sitting services if your were admitted for syncope or siezures until you have seen by Primary MD or a Neurologist and advised to do so again.

## 2022-01-14 LAB — HEMOGLOBIN A1C
Hgb A1c MFr Bld: 5.7 % — ABNORMAL HIGH (ref 4.8–5.6)
Mean Plasma Glucose: 117 mg/dL

## 2022-01-21 ENCOUNTER — Inpatient Hospital Stay: Payer: Self-pay | Admitting: Nurse Practitioner

## 2022-01-22 ENCOUNTER — Ambulatory Visit (INDEPENDENT_AMBULATORY_CARE_PROVIDER_SITE_OTHER): Payer: BLUE CROSS/BLUE SHIELD | Admitting: Nurse Practitioner

## 2022-01-22 ENCOUNTER — Emergency Department (HOSPITAL_COMMUNITY)
Admission: EM | Admit: 2022-01-22 | Discharge: 2022-01-22 | Disposition: A | Discharge status: Home / Self Care | Payer: BLUE CROSS/BLUE SHIELD | Attending: Medical | Admitting: Medical

## 2022-01-22 ENCOUNTER — Encounter: Payer: Self-pay | Admitting: Nurse Practitioner

## 2022-01-22 ENCOUNTER — Emergency Department (HOSPITAL_COMMUNITY): Payer: BLUE CROSS/BLUE SHIELD

## 2022-01-22 VITALS — BP 133/85 | HR 82 | Temp 97.2°F | Resp 18 | Ht 73.0 in | Wt 292.8 lb

## 2022-01-22 DIAGNOSIS — Z5321 Procedure and treatment not carried out due to patient leaving prior to being seen by health care provider: Secondary | ICD-10-CM | POA: Diagnosis not present

## 2022-01-22 DIAGNOSIS — F1290 Cannabis use, unspecified, uncomplicated: Secondary | ICD-10-CM

## 2022-01-22 DIAGNOSIS — F10930 Alcohol use, unspecified with withdrawal, uncomplicated: Secondary | ICD-10-CM | POA: Diagnosis not present

## 2022-01-22 DIAGNOSIS — F1410 Cocaine abuse, uncomplicated: Secondary | ICD-10-CM

## 2022-01-22 DIAGNOSIS — Z09 Encounter for follow-up examination after completed treatment for conditions other than malignant neoplasm: Secondary | ICD-10-CM

## 2022-01-22 DIAGNOSIS — Z716 Tobacco abuse counseling: Secondary | ICD-10-CM | POA: Insufficient documentation

## 2022-01-22 DIAGNOSIS — R569 Unspecified convulsions: Secondary | ICD-10-CM | POA: Insufficient documentation

## 2022-01-22 DIAGNOSIS — F1090 Alcohol use, unspecified, uncomplicated: Secondary | ICD-10-CM | POA: Insufficient documentation

## 2022-01-22 DIAGNOSIS — N1831 Chronic kidney disease, stage 3a: Secondary | ICD-10-CM | POA: Insufficient documentation

## 2022-01-22 DIAGNOSIS — F17200 Nicotine dependence, unspecified, uncomplicated: Secondary | ICD-10-CM | POA: Insufficient documentation

## 2022-01-22 DIAGNOSIS — F1010 Alcohol abuse, uncomplicated: Secondary | ICD-10-CM

## 2022-01-22 DIAGNOSIS — R0683 Snoring: Secondary | ICD-10-CM | POA: Insufficient documentation

## 2022-01-22 HISTORY — DX: Alcohol use, unspecified, uncomplicated: F10.90

## 2022-01-22 HISTORY — DX: Cannabis use, unspecified, uncomplicated: F12.90
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
## 2022-01-22 NOTE — ED Provider Triage Note (Signed)
Emergency Medicine Provider Triage Evaluation Note  Thomas Carter , a 27 y.o. male  was evaluated in triage.  Pt complains of needing a CXR per PCP. Denies respiratory symptoms, fatigue, fever. States he was in hospital 10 days ago d/t seizures d/t cocaine and alcohol use. Last drink was 10 days ago.  Review of Systems  Positive: none Negative: SOB  Physical Exam  BP (!) 173/92 (BP Location: Right Arm)   Pulse 80   Temp 97.7 F (36.5 C) (Oral)   Resp 16   SpO2 97%  Gen:   Awake, no distress   Resp:  Normal effort  MSK:   Moves extremities without difficulty  Medical Decision Making  Medically screening exam initiated at 9:23 AM.  Appropriate orders placed.  AYHAM WORD was informed that the remainder of the evaluation will be completed by another provider, this initial triage assessment does not replace that evaluation, and the importance of remaining in the ED until their evaluation is complete.   Osvaldo Shipper, [place] 01/22/22 [phone]

## 2022-01-22 NOTE — Assessment & Plan Note (Addendum)
Tested positive for cocaine and tetrahydrocannabinol at the hospital Need to avoid use of illicit drugs was discussed today

## 2022-01-22 NOTE — Assessment & Plan Note (Signed)
Patient referred to neurology for sleep study to rule out sleep apnea

## 2022-01-22 NOTE — ED Triage Notes (Signed)
Sent by PCP per patient for chest x-ray.  Seizure on 01/13/2022, denies any etoh and cocaine use since.  Denies vomiting during seizure.  Denies s/sy

## 2022-01-22 NOTE — Patient Instructions (Addendum)
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  [facility] [phone]   It is important that you exercise regularly at least 30 minutes 5 times a week as tolerated  Think about what you will eat, plan ahead. Choose " clean, green, fresh or frozen" over canned, processed or packaged foods which are more sugary, salty and fatty. 70 to 75% of food eaten should be vegetables and fruit. Three meals at set times with snacks allowed between meals, but they must be fruit or vegetables. Aim to eat over a 12 hour period , example 7 am to 7 pm, and STOP after  your last meal of the day. Drink water,generally about 64 ounces per day, no other drink is as healthy. Fruit juice is best enjoyed in a healthy way, by EATING the fruit.  Thanks for choosing [facility] we consider it a privelige to serve you.

## 2022-01-22 NOTE — Assessment & Plan Note (Signed)
Lab Results  Component Value Date   NA 137 01/13/2022   K 3.8 01/13/2022   CO2 24 01/13/2022   GLUCOSE 105 (H) 01/13/2022   BUN 10 01/13/2022   CREATININE 1.31 (H) 01/13/2022   CALCIUM 8.7 (L) 01/13/2022   GFRNONAA >60 01/13/2022  Check CMP Avoid NSAIDs and other nephrotoxic agents

## 2022-01-22 NOTE — Assessment & Plan Note (Signed)
Need to quit smoking marijuana including risk of addiction to illicit drugs was discussed today

## 2022-01-22 NOTE — Assessment & Plan Note (Signed)
Recently admitted at the hospital for seizures thought to be related to alcohol withdrawal, cocaine abuse.  Patient encouraged to follow-up with neurology.  He was encouraged to not drive or operate the machinery until he has been cleared by neurology.  No seizures since he left the hospital.  Continue to abstain from alcohol and cocaine

## 2022-01-22 NOTE — Assessment & Plan Note (Signed)
Patient was encouraged to continue to abstain from alcohol Take folic acid 1 mg daily, thiamine 100 mg daily as ordered.

## 2022-01-22 NOTE — Assessment & Plan Note (Addendum)
Admitted at the hospital  from January 11 2022 to January 13, 2022.  Patient had a witnessed tonic-clonic seizure at home that lasted for 1 minute, seizures was associated with tongue biting, HA.  Patient stated that his seizure first happened about 2 years ago he was evaluated by neurology at that time started on Keppra but he lost follow-up with them.  He was discharged home with Librium in counseled to abstain from alcohol, cocaine.  He was also told not to drive until cleared by neurology.  Hospital discharge summary Labs and imaging studies were reviewed by me today. Patient encouraged to follow-up with neurologist at [facility]

## 2022-01-22 NOTE — ED Notes (Signed)
Pt reports to greeter that he is leaving Advised to stay but patient refused

## 2022-01-22 NOTE — Assessment & Plan Note (Signed)
Smokes about less than 1 pack/day  Asked about quitting: confirms that he/she currently smokes cigarettes Advise to quit smoking: Educated about QUITTING to reduce the risk of cancer, cardio and cerebrovascular disease. Assess willingness: Unwilling to quit at this time, but is working on cutting back. Assist with counseling and pharmacotherapy: Counseled for 5 minutes and literature provided. Arrange for follow up: follow up in 4 months and continue to offer help.

## 2022-01-23 LAB — CMP14+EGFR
ALT: 31 IU/L (ref 0–44)
AST: 19 IU/L (ref 0–40)
Albumin/Globulin Ratio: 1.9 (ref 1.2–2.2)
Albumin: 4.5 g/dL (ref 4.3–5.2)
Alkaline Phosphatase: 71 IU/L (ref 44–121)
BUN/Creatinine Ratio: 12 (ref 9–20)
BUN: 15 mg/dL (ref 6–20)
Bilirubin Total: 0.3 mg/dL (ref 0.0–1.2)
CO2: 24 mmol/L (ref 20–29)
Calcium: 9.4 mg/dL (ref 8.7–10.2)
Chloride: 104 mmol/L (ref 96–106)
Creatinine, Ser: 1.25 mg/dL (ref 0.76–1.27)
Globulin, Total: 2.4 g/dL (ref 1.5–4.5)
Glucose: 88 mg/dL (ref 70–99)
Potassium: 4.3 mmol/L (ref 3.5–5.2)
Sodium: 140 mmol/L (ref 134–144)
Total Protein: 6.9 g/dL (ref 6.0–8.5)
eGFR: 80 mL/min/{1.73_m2} (ref >59)

## 2022-01-23 LAB — CBC
Hematocrit: 45.3 % (ref 37.5–51.0)
Hemoglobin: 15.4 g/dL (ref 13.0–17.7)
MCH: 29.9 pg (ref 26.6–33.0)
MCHC: 34 g/dL (ref 31.5–35.7)
MCV: 88 fL (ref 79–97)
Platelets: 301 10*3/uL (ref 150–450)
RBC: 5.15 x10E6/uL (ref 4.14–5.80)
RDW: 12.2 % (ref 11.6–15.4)
WBC: 5.7 10*3/uL (ref 3.4–10.8)

## 2022-01-23 NOTE — Progress Notes (Signed)
Normal labs, normal chest XRAY. Please follow up with neurology as dicussed. Please  avoid illicit drugs, tobacco and alcohol.

## 2022-02-02 ENCOUNTER — Encounter (HOSPITAL_COMMUNITY): Payer: Self-pay

## 2022-02-02 ENCOUNTER — Ambulatory Visit (HOSPITAL_COMMUNITY)
Admission: EM | Admit: 2022-02-02 | Discharge: 2022-02-02 | Disposition: A | Discharge status: Home / Self Care | Payer: BLUE CROSS/BLUE SHIELD | Attending: Emergency Medicine | Admitting: Emergency Medicine

## 2022-02-02 DIAGNOSIS — Z113 Encounter for screening for infections with a predominantly sexual mode of transmission: Secondary | ICD-10-CM | POA: Diagnosis not present

## 2022-02-02 DIAGNOSIS — R300 Dysuria: Secondary | ICD-10-CM | POA: Diagnosis present

## 2022-02-02 LAB — POCT URINALYSIS DIPSTICK, ED / UC
Bilirubin Urine: NEGATIVE
Glucose, UA: NEGATIVE mg/dL
Hgb urine dipstick: NEGATIVE
Leukocytes,Ua: NEGATIVE
Nitrite: NEGATIVE
Protein, ur: 30 mg/dL — AB
Specific Gravity, Urine: >=1.03 (ref 1.005–1.030)
Urobilinogen, UA: 1 mg/dL (ref 0.0–1.0)
pH: 6 (ref 5.0–8.0)

## 2022-02-02 LAB — HIV ANTIBODY (ROUTINE TESTING W REFLEX): HIV Screen 4th Generation wRfx: NONREACTIVE

## 2022-02-02 NOTE — Discharge Instructions (Addendum)
We will call you if anything on your swab or blood work returns positive. Please abstain from sexual intercourse until your results return. 
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
## 2022-02-02 NOTE — ED Triage Notes (Signed)
Pt has been having some burning when urinating  x 3days

## 2022-02-03 LAB — RPR: RPR Ser Ql: NONREACTIVE

## 2022-02-04 LAB — CYTOLOGY, (ORAL, ANAL, URETHRAL) ANCILLARY ONLY
Chlamydia: NEGATIVE
Comment: NEGATIVE
Comment: NEGATIVE
Comment: NORMAL
Neisseria Gonorrhea: NEGATIVE
Trichomonas: NEGATIVE

## 2022-02-06 ENCOUNTER — Other Ambulatory Visit: Payer: Self-pay

## 2022-02-06 ENCOUNTER — Telehealth: Payer: Self-pay

## 2022-02-06 NOTE — Telephone Encounter (Signed)
Called pt to find out if he Is moving to a different practice. Lvm for pt to call office back. [place]

## 2022-02-28 ENCOUNTER — Ambulatory Visit: Payer: Self-pay | Admitting: Family Medicine

## 2022-03-08 ENCOUNTER — Encounter: Payer: Self-pay | Admitting: Diagnostic Neuroimaging

## 2022-03-08 ENCOUNTER — Institutional Professional Consult (permissible substitution): Payer: Self-pay | Admitting: Diagnostic Neuroimaging

## 2022-04-23 ENCOUNTER — Encounter (HOSPITAL_COMMUNITY): Payer: Self-pay | Admitting: *Deleted

## 2022-04-23 ENCOUNTER — Ambulatory Visit (HOSPITAL_COMMUNITY)
Admission: EM | Admit: 2022-04-23 | Discharge: 2022-04-23 | Disposition: A | Discharge status: Home / Self Care | Payer: BLUE CROSS/BLUE SHIELD | Attending: Family Medicine | Admitting: Family Medicine

## 2022-04-23 DIAGNOSIS — N342 Other urethritis: Secondary | ICD-10-CM | POA: Diagnosis present

## 2022-04-23 DIAGNOSIS — R300 Dysuria: Secondary | ICD-10-CM | POA: Diagnosis not present

## 2022-04-23 LAB — POCT URINALYSIS DIPSTICK
Bilirubin, UA: NEGATIVE
Glucose, UA: NEGATIVE mg/dL
Ketones, POC UA: NEGATIVE mg/dL
Leukocytes, UA: NEGATIVE
Nitrite, UA: NEGATIVE
Protein Ur, POC: NEGATIVE mg/dL
Spec Grav, UA: >=1.03 — AB (ref 1.010–1.025)
Urobilinogen, UA: 2 E.U./dL — AB
pH, UA: 6 (ref 5.0–8.0)
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
## 2022-04-23 NOTE — Discharge Instructions (Signed)
Your urinalysis did not have any white blood cells or nitrites and it; it does not look like this is a urinary or bladder infection.  Staff will notify you if anything is positive on your swab test so you can be treated

## 2022-04-23 NOTE — ED Triage Notes (Signed)
Pt states he had unprotected sex 3-4 days ago now having some dysuria and urine frequency.

## 2022-04-24 LAB — CYTOLOGY, (ORAL, ANAL, URETHRAL) ANCILLARY ONLY
Chlamydia: NEGATIVE
Comment: NEGATIVE
Comment: NEGATIVE
Comment: NORMAL
Neisseria Gonorrhea: NEGATIVE
Trichomonas: NEGATIVE

## 2022-05-24 ENCOUNTER — Ambulatory Visit: Payer: Self-pay | Admitting: Nurse Practitioner

## 2022-10-16 ENCOUNTER — Emergency Department (HOSPITAL_COMMUNITY)
Admission: EM | Admit: 2022-10-16 | Discharge: 2022-10-17 | Disposition: A | Discharge status: Home / Self Care | Payer: Medicaid Other | Attending: Emergency Medicine | Admitting: Emergency Medicine

## 2022-10-16 ENCOUNTER — Other Ambulatory Visit: Payer: Self-pay

## 2022-10-16 ENCOUNTER — Emergency Department (HOSPITAL_COMMUNITY): Payer: Medicaid Other

## 2022-10-16 ENCOUNTER — Encounter (HOSPITAL_COMMUNITY): Payer: Self-pay

## 2022-10-16 DIAGNOSIS — F1910 Other psychoactive substance abuse, uncomplicated: Secondary | ICD-10-CM | POA: Diagnosis not present

## 2022-10-16 DIAGNOSIS — N189 Chronic kidney disease, unspecified: Secondary | ICD-10-CM | POA: Insufficient documentation

## 2022-10-16 DIAGNOSIS — J45909 Unspecified asthma, uncomplicated: Secondary | ICD-10-CM | POA: Diagnosis not present

## 2022-10-16 DIAGNOSIS — R451 Restlessness and agitation: Secondary | ICD-10-CM | POA: Insufficient documentation

## 2022-10-16 DIAGNOSIS — E876 Hypokalemia: Secondary | ICD-10-CM | POA: Insufficient documentation

## 2022-10-16 DIAGNOSIS — R4182 Altered mental status, unspecified: Secondary | ICD-10-CM | POA: Diagnosis not present

## 2022-10-16 LAB — CBC WITH DIFFERENTIAL/PLATELET
Abs Immature Granulocytes: 0.04 10*3/uL (ref 0.00–0.07)
Basophils Absolute: 0 10*3/uL (ref 0.0–0.1)
Basophils Relative: 0 %
Eosinophils Absolute: 0.1 10*3/uL (ref 0.0–0.5)
Eosinophils Relative: 1 %
HCT: 43.5 % (ref 39.0–52.0)
Hemoglobin: 14.7 g/dL (ref 13.0–17.0)
Immature Granulocytes: 0 %
Lymphocytes Relative: 24 %
Lymphs Abs: 2.7 10*3/uL (ref 0.7–4.0)
MCH: 29.8 pg (ref 26.0–34.0)
MCHC: 33.8 g/dL (ref 30.0–36.0)
MCV: 88.2 fL (ref 80.0–100.0)
Monocytes Absolute: 0.9 10*3/uL (ref 0.1–1.0)
Monocytes Relative: 8 %
Neutro Abs: 7.5 10*3/uL (ref 1.7–7.7)
Neutrophils Relative %: 67 %
Platelets: 269 10*3/uL (ref 150–400)
RBC: 4.93 MIL/uL (ref 4.22–5.81)
RDW: 13.3 % (ref 11.5–15.5)
WBC: 11.1 10*3/uL — ABNORMAL HIGH (ref 4.0–10.5)
nRBC: 0 % (ref 0.0–0.2)

## 2022-10-16 LAB — I-STAT CHEM 8, ED
BUN: 17 mg/dL (ref 6–20)
Calcium, Ion: 1.14 mmol/L — ABNORMAL LOW (ref 1.15–1.40)
Chloride: 102 mmol/L (ref 98–111)
Creatinine, Ser: 1.6 mg/dL — ABNORMAL HIGH (ref 0.61–1.24)
Glucose, Bld: 151 mg/dL — ABNORMAL HIGH (ref 70–99)
HCT: 43 % (ref 39.0–52.0)
Hemoglobin: 14.6 g/dL (ref 13.0–17.0)
Potassium: 3.3 mmol/L — ABNORMAL LOW (ref 3.5–5.1)
Sodium: 140 mmol/L (ref 135–145)
TCO2: 24 mmol/L (ref 22–32)

## 2022-10-16 LAB — COMPREHENSIVE METABOLIC PANEL
ALT: 28 U/L (ref 0–44)
AST: 22 U/L (ref 15–41)
Albumin: 3.9 g/dL (ref 3.5–5.0)
Alkaline Phosphatase: 67 U/L (ref 38–126)
Anion gap: 15 (ref 5–15)
BUN: 16 mg/dL (ref 6–20)
CO2: 20 mmol/L — ABNORMAL LOW (ref 22–32)
Calcium: 9.1 mg/dL (ref 8.9–10.3)
Chloride: 103 mmol/L (ref 98–111)
Creatinine, Ser: 1.54 mg/dL — ABNORMAL HIGH (ref 0.61–1.24)
GFR, Estimated: >60 mL/min (ref >60)
Glucose, Bld: 152 mg/dL — ABNORMAL HIGH (ref 70–99)
Potassium: 3.2 mmol/L — ABNORMAL LOW (ref 3.5–5.1)
Sodium: 138 mmol/L (ref 135–145)
Total Bilirubin: 0.5 mg/dL (ref 0.3–1.2)
Total Protein: 6.9 g/dL (ref 6.5–8.1)

## 2022-10-16 LAB — ETHANOL: Alcohol, Ethyl (B): 10 mg/dL — ABNORMAL HIGH (ref <10)

## 2022-10-16 MED ORDER — LACTATED RINGERS IV BOLUS
1000.0000 mL | Freq: Once | INTRAVENOUS | Status: AC
  Administered 2022-10-17: 1000 mL via INTRAVENOUS

## 2022-10-16 NOTE — ED Triage Notes (Signed)
Pt to ED BIB [facility] from home with complaint of severe anxiety s/p marijuana use. Denied CP. Upon arrival to ED, unable to follow commands & attempting to fall off EMS stretching on to floor. Received 5mg  midazolam per EMS. Security at bedside, restraints applied, and moved to ED stretcher. Improving s/p midazolam administration.
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
## 2022-10-17 LAB — URINALYSIS, ROUTINE W REFLEX MICROSCOPIC
Bacteria, UA: NONE SEEN
Bilirubin Urine: NEGATIVE
Glucose, UA: NEGATIVE mg/dL
Hgb urine dipstick: NEGATIVE
Ketones, ur: NEGATIVE mg/dL
Leukocytes,Ua: NEGATIVE
Nitrite: NEGATIVE
Protein, ur: 100 mg/dL — AB
Specific Gravity, Urine: 1.017 (ref 1.005–1.030)
pH: 5 (ref 5.0–8.0)

## 2022-10-17 LAB — RAPID URINE DRUG SCREEN, HOSP PERFORMED
Amphetamines: NOT DETECTED
Barbiturates: NOT DETECTED
Benzodiazepines: POSITIVE — AB
Cocaine: POSITIVE — AB
Opiates: NOT DETECTED
Tetrahydrocannabinol: POSITIVE — AB

## 2022-10-17 LAB — SALICYLATE LEVEL: Salicylate Lvl: <7 mg/dL — ABNORMAL LOW (ref 7.0–30.0)

## 2022-10-17 LAB — CK: Total CK: 163 U/L (ref 49–397)

## 2022-10-17 LAB — ACETAMINOPHEN LEVEL: Acetaminophen (Tylenol), Serum: <10 ug/mL — ABNORMAL LOW (ref 10–30)

## 2022-10-17 MED ORDER — LACTATED RINGERS IV BOLUS
1000.0000 mL | Freq: Once | INTRAVENOUS | Status: AC
  Administered 2022-10-17: 1000 mL via INTRAVENOUS

## 2022-10-17 MED ORDER — POTASSIUM CHLORIDE CRYS ER 20 MEQ PO TBCR
40.0000 meq | EXTENDED_RELEASE_TABLET | Freq: Once | ORAL | Status: AC
  Administered 2022-10-17: 40 meq via ORAL
  Filled 2022-10-17: qty 2

## 2022-10-17 NOTE — ED Notes (Signed)
Pt ambulated in hallway with steady gait.

## 2022-10-17 NOTE — Discharge Instructions (Signed)
Recommend avoiding further use of cocaine and marijuana.  Please follow up with your primary care provider. [facility] as needed.

## 2023-01-28 ENCOUNTER — Encounter (HOSPITAL_COMMUNITY): Payer: Self-pay | Admitting: Internal Medicine

## 2023-01-28 ENCOUNTER — Observation Stay (HOSPITAL_COMMUNITY): Payer: Medicaid Other

## 2023-01-28 ENCOUNTER — Other Ambulatory Visit: Payer: Self-pay

## 2023-01-28 ENCOUNTER — Emergency Department (HOSPITAL_COMMUNITY): Payer: Medicaid Other

## 2023-01-28 ENCOUNTER — Other Ambulatory Visit (HOSPITAL_COMMUNITY): Payer: Self-pay

## 2023-01-28 ENCOUNTER — Observation Stay (HOSPITAL_COMMUNITY)
Admission: EM | Admit: 2023-01-28 | Discharge: 2023-01-28 | Disposition: A | Discharge status: Home / Self Care | Payer: Medicaid Other | Attending: Internal Medicine | Admitting: Internal Medicine

## 2023-01-28 DIAGNOSIS — F1910 Other psychoactive substance abuse, uncomplicated: Secondary | ICD-10-CM | POA: Insufficient documentation

## 2023-01-28 DIAGNOSIS — F17290 Nicotine dependence, other tobacco product, uncomplicated: Secondary | ICD-10-CM | POA: Diagnosis not present

## 2023-01-28 DIAGNOSIS — R569 Unspecified convulsions: Secondary | ICD-10-CM | POA: Diagnosis not present

## 2023-01-28 DIAGNOSIS — I10 Essential (primary) hypertension: Secondary | ICD-10-CM | POA: Diagnosis not present

## 2023-01-28 DIAGNOSIS — G4489 Other headache syndrome: Secondary | ICD-10-CM | POA: Diagnosis not present

## 2023-01-28 DIAGNOSIS — G40909 Epilepsy, unspecified, not intractable, without status epilepticus: Principal | ICD-10-CM | POA: Insufficient documentation

## 2023-01-28 DIAGNOSIS — R000 Tachycardia, unspecified: Secondary | ICD-10-CM | POA: Diagnosis not present

## 2023-01-28 DIAGNOSIS — J45909 Unspecified asthma, uncomplicated: Secondary | ICD-10-CM | POA: Diagnosis not present

## 2023-01-28 DIAGNOSIS — G9389 Other specified disorders of brain: Secondary | ICD-10-CM | POA: Diagnosis not present

## 2023-01-28 DIAGNOSIS — R561 Post traumatic seizures: Secondary | ICD-10-CM

## 2023-01-28 DIAGNOSIS — N179 Acute kidney failure, unspecified: Secondary | ICD-10-CM | POA: Insufficient documentation

## 2023-01-28 LAB — CBC WITH DIFFERENTIAL/PLATELET
Abs Immature Granulocytes: 0.03 10*3/uL (ref 0.00–0.07)
Basophils Absolute: 0 10*3/uL (ref 0.0–0.1)
Basophils Relative: 0 %
Eosinophils Absolute: 0.1 10*3/uL (ref 0.0–0.5)
Eosinophils Relative: 1 %
HCT: 41.6 % (ref 39.0–52.0)
Hemoglobin: 14.3 g/dL (ref 13.0–17.0)
Immature Granulocytes: 0 %
Lymphocytes Relative: 29 %
Lymphs Abs: 2.3 10*3/uL (ref 0.7–4.0)
MCH: 30 pg (ref 26.0–34.0)
MCHC: 34.4 g/dL (ref 30.0–36.0)
MCV: 87.4 fL (ref 80.0–100.0)
Monocytes Absolute: 0.5 10*3/uL (ref 0.1–1.0)
Monocytes Relative: 6 %
Neutro Abs: 5.1 10*3/uL (ref 1.7–7.7)
Neutrophils Relative %: 64 %
Platelets: 249 10*3/uL (ref 150–400)
RBC: 4.76 MIL/uL (ref 4.22–5.81)
RDW: 12.7 % (ref 11.5–15.5)
WBC: 8 10*3/uL (ref 4.0–10.5)
nRBC: 0 % (ref 0.0–0.2)

## 2023-01-28 LAB — RAPID URINE DRUG SCREEN, HOSP PERFORMED
Amphetamines: NOT DETECTED
Barbiturates: NOT DETECTED
Benzodiazepines: NOT DETECTED
Cocaine: POSITIVE — AB
Opiates: NOT DETECTED
Tetrahydrocannabinol: NOT DETECTED

## 2023-01-28 LAB — COMPREHENSIVE METABOLIC PANEL
ALT: 29 U/L (ref 0–44)
AST: 27 U/L (ref 15–41)
Albumin: 3.7 g/dL (ref 3.5–5.0)
Alkaline Phosphatase: 68 U/L (ref 38–126)
Anion gap: 14 (ref 5–15)
BUN: 10 mg/dL (ref 6–20)
CO2: 19 mmol/L — ABNORMAL LOW (ref 22–32)
Calcium: 9 mg/dL (ref 8.9–10.3)
Chloride: 104 mmol/L (ref 98–111)
Creatinine, Ser: 1.5 mg/dL — ABNORMAL HIGH (ref 0.61–1.24)
GFR, Estimated: >60 mL/min (ref >60)
Glucose, Bld: 142 mg/dL — ABNORMAL HIGH (ref 70–99)
Potassium: 3.7 mmol/L (ref 3.5–5.1)
Sodium: 137 mmol/L (ref 135–145)
Total Bilirubin: 0.6 mg/dL (ref 0.0–1.2)
Total Protein: 6.7 g/dL (ref 6.5–8.1)

## 2023-01-28 LAB — CK: Total CK: 192 U/L (ref 49–397)

## 2023-01-28 LAB — ETHANOL: Alcohol, Ethyl (B): <10 mg/dL (ref <10)

## 2023-01-28 MED ORDER — LACOSAMIDE 50 MG PO TABS: 50.0000 mg | ORAL_TABLET | Freq: Two times a day (BID) | ORAL | 0 refills | Status: DC

## 2023-01-28 MED ORDER — MORPHINE SULFATE (PF) 2 MG/ML IV SOLN: 1.0000 mg | INTRAVENOUS | Status: DC | PRN

## 2023-01-28 MED ORDER — ACETAMINOPHEN 650 MG RE SUPP: 650.0000 mg | Freq: Four times a day (QID) | RECTAL | Status: DC | PRN

## 2023-01-28 MED ORDER — OXYCODONE HCL 5 MG PO TABS: 5.0000 mg | ORAL_TABLET | ORAL | Status: DC | PRN

## 2023-01-28 MED ORDER — ALBUTEROL SULFATE (2.5 MG/3ML) 0.083% IN NEBU: 2.5000 mg | INHALATION_SOLUTION | Freq: Four times a day (QID) | RESPIRATORY_TRACT | Status: DC | PRN

## 2023-01-28 MED ORDER — ACETAMINOPHEN 325 MG PO TABS
650.0000 mg | ORAL_TABLET | Freq: Four times a day (QID) | ORAL | Status: DC | PRN
  Administered 2023-01-28: 650 mg via ORAL
  Filled 2023-01-28: qty 2

## 2023-01-28 MED ORDER — POLYETHYLENE GLYCOL 3350 17 G PO PACK: 17.0000 g | PACK | Freq: Every day | ORAL | Status: DC | PRN

## 2023-01-28 MED ORDER — THIAMINE HCL 100 MG/ML IJ SOLN
100.0000 mg | Freq: Every day | INTRAMUSCULAR | Status: DC
Start: 2023-01-28 — End: 2023-01-28

## 2023-01-28 MED ORDER — ENOXAPARIN SODIUM 40 MG/0.4ML IJ SOSY
40.0000 mg | PREFILLED_SYRINGE | INTRAMUSCULAR | Status: DC
  Administered 2023-01-28: 40 mg via SUBCUTANEOUS
  Filled 2023-01-28: qty 0.4

## 2023-01-28 MED ORDER — THIAMINE MONONITRATE 100 MG PO TABS
100.0000 mg | ORAL_TABLET | Freq: Every day | ORAL | Status: DC
  Administered 2023-01-28: 100 mg via ORAL
  Filled 2023-01-28: qty 1

## 2023-01-28 MED ORDER — LORAZEPAM 2 MG/ML IJ SOLN: 1.0000 mg | INTRAMUSCULAR | Status: DC | PRN

## 2023-01-28 MED ORDER — FOLIC ACID 1 MG PO TABS
1.0000 mg | ORAL_TABLET | Freq: Every day | ORAL | Status: DC
Start: 2023-01-28 — End: 2023-01-28
  Administered 2023-01-28: 1 mg via ORAL
  Filled 2023-01-28: qty 1

## 2023-01-28 MED ORDER — LACOSAMIDE 50 MG PO TABS
50.0000 mg | ORAL_TABLET | Freq: Two times a day (BID) | ORAL | 2 refills | Status: DC
  Filled 2023-01-28: qty 60, 30d supply, fill #0

## 2023-01-28 MED ORDER — VALPROATE SODIUM 100 MG/ML IV SOLN
2500.0000 mg | Freq: Once | INTRAVENOUS | Status: AC
  Administered 2023-01-28: 2500 mg via INTRAVENOUS
  Filled 2023-01-28: qty 25

## 2023-01-28 MED ORDER — ADULT MULTIVITAMIN W/MINERALS CH
1.0000 | ORAL_TABLET | Freq: Every day | ORAL | Status: DC
  Administered 2023-01-28: 1 via ORAL
  Filled 2023-01-28: qty 1

## 2023-01-28 MED ORDER — BISACODYL 5 MG PO TBEC: 5.0000 mg | DELAYED_RELEASE_TABLET | Freq: Every day | ORAL | Status: DC | PRN

## 2023-01-28 MED ORDER — LACOSAMIDE 50 MG PO TABS
50.0000 mg | ORAL_TABLET | Freq: Two times a day (BID) | ORAL | Status: DC
Start: 2023-01-28 — End: 2023-01-28
  Administered 2023-01-28: 50 mg via ORAL
  Filled 2023-01-28: qty 1

## 2023-01-28 MED ORDER — HYDRALAZINE HCL 20 MG/ML IJ SOLN: 10.0000 mg | Freq: Four times a day (QID) | INTRAMUSCULAR | Status: DC | PRN

## 2023-01-28 MED ORDER — LORAZEPAM 1 MG PO TABS: 1.0000 mg | ORAL_TABLET | ORAL | Status: DC | PRN

## 2023-01-28 NOTE — Procedures (Signed)
Patient Name: Thomas Carter  MRN: [id number]  Epilepsy Attending: Charlsie Quest  Referring Physician/Provider: Achille Rich, PA-C  Date: 01/28/2023 Duration: 22.07 mins  Patient history: 28 y.o. male with h/o TBI with encephalomalacia, polysubstance use presents emerged part today for evaluation of seizure. EEG to evaluate for seizure  Level of alertness: Awake, asleep  AEDs during EEG study: None  Technical aspects: This EEG study was done with scalp electrodes positioned according to the 10-20 International system of electrode placement. Electrical activity was reviewed with band pass filter of 1-70Hz , sensitivity of 7 uV/mm, display speed of 66mm/sec with a 60Hz  notched filter applied as appropriate. EEG data were recorded continuously and digitally stored.  Video monitoring was available and reviewed as appropriate.  Description: The posterior dominant rhythm consists of 9 Hz activity of moderate voltage (25-35 uV) seen predominantly in posterior head regions, symmetric and reactive to eye opening and eye closing. Sleep was characterized by vertex waves, sleep spindles (12 to 14 Hz), maximal frontocentral region. Hyperventilation and photic stimulation were not performed.     IMPRESSION: This study is within normal limits. No seizures or epileptiform discharges were seen throughout the recording.  A normal interictal EEG does not exclude the diagnosis of epilepsy.  Thomas Carter [name]
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
## 2023-01-28 NOTE — ED Provider Notes (Cosign Needed Addendum)
[place] EMERGENCY DEPARTMENT AT [facility] Provider Note   CSN: [id number] Arrival date & time: 01/28/23  0257     History Chief Complaint  Patient presents with   Seizures    Thomas Carter is a 28 y.o. male with h/o TBI with encephalomalacia, polysubstance use presents emerged part today for evaluation of seizure.  History is limited as patient is not, anyone at bedside.  I have called the patient's mother multiple times and left voicemails but have not received any phone calls back.  I confirmed the phone number the patient and he reports that that is correct and does not have any other additional way to reach his mother.  Given this, the history is obtained from the nursing note with the EMS handoff.  EMS was called with the patient's mother due to having a "grand mal seizure" in his sleep last approximate 3 to 5 minutes.  He is to see a neurologist however has not seen him in a while and is currently not taking any medications for seizures, no received prescribed any.  He does have a history of a TBI.  The patient does report that he has used cocaine and marijuana today around noon.  He denies any fecal or urinary incontinence.  His only complaint is fatigue.  He reports he does not drink alcohol he used to.  He reports he usually only drinks on weekend.  His last drink was around 3 days ago. He doesn't recall when he last had a seizure.  Seizures      Home Medications Prior to Admission medications   Not on File      Allergies    Patient has no known allergies.    Review of Systems   Review of Systems  Unable to perform ROS: Other (post ictal)  Neurological:  Positive for seizures.    Physical Exam Updated Vital Signs BP 128/64 (BP Location: Right Arm)   Pulse (!) 104   Temp (!) 97.5 F (36.4 C) (Oral)   Resp 19   Ht 6\' 3"  (1.905 m)   Wt 124.7 kg   SpO2 96%   BMI 34.37 kg/m  Physical Exam Vitals and nursing note reviewed.  Constitutional:       Comments: Somnolent, but awakens to voice.  HENT:     Head: Normocephalic and atraumatic.     Mouth/Throat:     Comments: Patient does have bite mark noted to the right lateral aspect of the tongue.  Dentition intact. Eyes:     General: No scleral icterus. Cardiovascular:     Rate and Rhythm: Normal rate.  Pulmonary:     Effort: Pulmonary effort is normal. No respiratory distress.  Abdominal:     Palpations: Abdomen is soft.     Tenderness: There is no abdominal tenderness. There is no guarding or rebound.  Musculoskeletal:        General: No tenderness.  Skin:    General: Skin is warm and dry.  Neurological:     Mental Status: He is oriented to person, place, and time.     GCS: GCS eye subscore is 4. GCS verbal subscore is 5. GCS motor subscore is 6.     Cranial Nerves: No cranial nerve deficit, dysarthria or facial asymmetry.     Sensory: No sensory deficit.     Motor: No weakness or pronator drift.     ED Results / Procedures / Treatments   Labs (all labs ordered are listed, but  only abnormal results are displayed) Labs Reviewed  RAPID URINE DRUG SCREEN, HOSP PERFORMED - Abnormal; Notable for the following components:      Result Value   Cocaine POSITIVE (*)    All other components within normal limits  COMPREHENSIVE METABOLIC PANEL - Abnormal; Notable for the following components:   CO2 19 (*)    Glucose, Bld 142 (*)    Creatinine, Ser 1.50 (*)    All other components within normal limits  ETHANOL  CK  CBC WITH DIFFERENTIAL/PLATELET    EKG EKG Interpretation Date/Time:  Tuesday January 28 2023 03:07:19 EST Ventricular Rate:  100 PR Interval:  169 QRS Duration:  93 QT Interval:  333 QTC Calculation: 430 R Axis:   100  Text Interpretation: Sinus tachycardia Borderline right axis deviation Nonspecific T abnormalities, inferior leads No significant change since last tracing Confirmed by Zadie Rhine ([id number]) on 01/28/2023 3:13:02 AM  Radiology CT Head Wo  Contrast Result Date: 01/28/2023 CLINICAL DATA:  28 year old male with acute seizure. Reportedly prior history but not on seizure medication at this time. EXAM: CT HEAD WITHOUT CONTRAST TECHNIQUE: Contiguous axial images were obtained from the base of the skull through the vertex without intravenous contrast. RADIATION DOSE REDUCTION: This exam was performed according to the departmental dose-optimization program which includes automated exposure control, adjustment of the mA and/or kV according to patient size and/or use of iterative reconstruction technique. COMPARISON:  Head CT 10/17/2022 and earlier. FINDINGS: Brain: Normal background cerebral volume. No midline shift, ventriculomegaly, mass effect, evidence of mass lesion, intracranial hemorrhage or evidence of cortically based acute infarction. Encephalomalacia right temporal tip, temporal lobe (series 4, image 11) which probably is posttraumatic, traumatic intracranial hemorrhage there in 2011. Elsewhere gray-white differentiation appears stable and within normal limits. Vascular: Stable.  No suspicious intracranial vascular hyperdensity. Skull: Subtle chronic left lamina papyracea fracture, and possible chronic nasal bone fractures are stable. No acute osseous abnormality identified. Sinuses/Orbits: Scattered mild paranasal sinus mucosal thickening and bubbly opacity but overall well aerated. Tympanic cavities and mastoids remain clear. Other: Similar Disconjugate gaze. No acute orbit or scalp soft tissue injury identified. IMPRESSION: 1. No acute intracranial abnormality. 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 2. Minimal new paranasal sinus inflammation. Electronically Signed   By: Odessa Fleming M.D.   On: 01/28/2023 05:36    Procedures Procedures   Medications Ordered in ED Medications  LORazepam (ATIVAN) tablet 1-4 mg (has no administration in time range)    Or  LORazepam (ATIVAN) injection 1-4 mg (has no administration  in time range)  thiamine (VITAMIN B1) tablet 100 mg (has no administration in time range)    Or  thiamine (VITAMIN B1) injection 100 mg (has no administration in time range)  folic acid (FOLVITE) tablet 1 mg (has no administration in time range)  multivitamin with minerals tablet 1 tablet (has no administration in time range)    ED Course/ Medical Decision Making/ A&P Clinical Course as of 01/28/23 0657  Tue Jan 28, 2023  0342 Attempted to call mother x2 and went straight to voicemail. I left a HIPAA Compliant voicemail.  [RR]  [phone] Attempted to call mother again and went straight to voicemail. They patient confirms the number is correct. He also tried calling her and reports it went to voicemail. He thinks she likely went back to sleep. Does not have any other number to contact her with.  [RR]  [phone] Reconsulted neurology as I have not heard back from the original page at 0430 [  RR]  [phone] Dr. Otelia Limes recommendation for Depakote loading dose of 20mg /kg. The patient is 124.7kg = 2494mg . Depakote load ordered. Reports that the patient will need to be on 5 mg/kg TID and started 8 hours after the loading dose.  [RR]    Clinical Course User Index [RR] Achille Rich, PA-C                                Medical Decision Making Amount and/or Complexity of Data Reviewed Labs: ordered. Radiology: ordered.  Risk OTC drugs. Prescription drug management. Decision regarding hospitalization.   28 y.o. male presents to the ER for evaluation of seizures. Differential diagnosis includes but is not limited to seizure, polysubstance use, psychogenic. Vital signs mildly tachycardic otherwise unremarkable. Physical exam as noted above.   I independently reviewed and interpreted the patient's labs. UDS positive with cocaine. CMP shows bicarb 19, glucose 142. Creatinine at 1.50, appears to be baseline. CBC without leukocytosis or anemia. CK within normal limits.  Ethanol negative.  CT imaging shows 1. No  acute intracranial abnormality. 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 2. Minimal new paranasal sinus inflammation.   I consulted neurology and spoke with Dr. Otelia Limes. From his chart review, the patient was seen by a neurologist in July 2022 and was started on Keppra however he stopped shortly after d/t some hallucinations and did not continue. Has not followed with neurology since. He was seen at that time d/t first onset seziure, thought possible drug related. He was seen in January 2024 for another seizure. They thought likely alcohol related. He was given Keppra load, but did not recommend being prescribed this. I discussed that I am unable to obtain much history given that his mother is not answering the phone. Recommends Depakote load and the patient needs to be start on Depakote PO 5mg /kg TID.  He is recommending EEG.  The patient will need to be given home medication of Depakote as listed above.  He reports the patient is at increased risk for seizures given his encephalomalacia and TBI.  Admit to [facility] hospital[facility].  Portions of this report may have been transcribed using voice recognition software. Every effort was made to ensure accuracy; however, inadvertent computerized transcription errors may be present.   Final Clinical Impression(s) / ED Diagnoses Final diagnoses:  Seizure [facility])  Polysubstance abuse [facility])    Rx / DC Orders ED Discharge Orders     None         Achille Rich, PA-C 01/28/23 0754    Achille Rich, PA-C 01/28/23 5784    Zadie Rhine, MD 01/29/23 [phone]

## 2023-01-28 NOTE — TOC Benefit Eligibility Note (Signed)
Pharmacy Patient Advocate Encounter  Insurance verification completed.   The patient is insured through [employer] [facility]   Ran test claim for Lacosamide. Currently a quantity of 60 is a 30 day supply and the co-pay is $4.00 .   This test claim was processed through [facility]- copay amounts may vary at other pharmacies due to pharmacy/plan contracts, or as the patient moves through the different stages of their insurance plan.
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
## 2023-01-28 NOTE — Progress Notes (Signed)
Nursing Discharge Note   Name: Thomas Carter MRN: [id number] DOB: [birth year]    Admit Date:  01/28/2023  Discharge Date:  01/28/2023   Thomas Carter is to be discharged home per MD order.  AVS completed. Reviewed with patient and family at bedside. Highlighted copy provided for patient to take home.  Patient/caregiver able to verbalize understanding of discharge instructions. PIV removed. Patient stable upon discharge.   Verbalized understanding of need to make follow up appointment with Neurology. Verbalized understanding of need to be complaint with anti-seizure medication regimen. Verbalized understanding of need to pick up prescription for Vimpat at home pharmacy.    Discharge Instructions   None     Discharge Instructions     Ambulatory referral to Neurology   Complete by: As directed    An appointment is requested in approximately: 8 weeks   Call MD for:  difficulty breathing, headache or visual disturbances   Complete by: As directed    Call MD for:  extreme fatigue   Complete by: As directed    Call MD for:  hives   Complete by: As directed    Call MD for:  persistant dizziness or light-headedness   Complete by: As directed    Call MD for:  persistant nausea and vomiting   Complete by: As directed    Call MD for:  severe uncontrolled pain   Complete by: As directed    Call MD for:  temperature >100.4   Complete by: As directed    Diet general   Complete by: As directed    Discharge instructions   Complete by: As directed    You have been started on Vimpat 50mg  BID.  Ensure compliance with seizure meds. Follow up with neurology as an outpatient. Stop drug abuse.  Seizure precautions: -Per [facility] statutes, patients with seizures are not allowed to drive until they have been seizure-free for six months.  -Use caution when using heavy equipment or power tools.  -Avoid working on ladders or at heights.  -Take showers instead of baths. Ensure the  water temperature is not too high on the home water heater.  -Do not go swimming alone.  -Do not lock yourself in a room alone (i.e. bathroom). -When caring for infants or small children, sit down when holding, feeding, or changing them to minimize risk of injury to the child in the event you have a seizure.  -Maintain good sleep hygiene. -Avoid alcohol.    If patient has another seizure, call 911 and bring them back to the ED if: A.  The seizure lasts longer than 5 minutes.      B.  The patient doesn't wake shortly after the seizure or has new problems such as difficulty seeing, speaking or moving following the seizure C.  The patient was injured during the seizure D.  The patient has a temperature over 102 F (39C) E.  The patient vomited during the seizure and now is having trouble breathing      General discharge instructions: Follow with Primary MD Thomas Beers, FNP in 7 days  Please request your PCP  to go over your hospital tests, procedures, radiology results at the follow up. Please get your medicines reviewed and adjusted.  Your PCP may decide to repeat certain labs or tests as needed. Do not drive, operate heavy machinery, perform activities at heights, swimming or participation in water activities or provide baby sitting services if your were admitted for syncope  or siezures until you have seen by Primary MD or a Neurologist and advised to do so again. [place] Controlled Substance Reporting System database was reviewed. Do not drive, operate heavy machinery, perform activities at heights, swim, participate in water activities or provide baby-sitting services while on medications for pain, sleep and mood until your outpatient physician has reevaluated you and advised to do so again.  You are strongly recommended to comply with the dose, frequency and duration of prescribed medications. Activity: As tolerated with Full fall precautions use walker/cane & assistance as  needed Avoid using any recreational substances like cigarette, tobacco, alcohol, or non-prescribed drug. If you experience worsening of your admission symptoms, develop shortness of breath, life threatening emergency, suicidal or homicidal thoughts you must seek medical attention immediately by calling 911 or calling your MD immediately  if symptoms less severe. You must read complete instructions/literature along with all the possible adverse reactions/side effects for all the medicines you take and that have been prescribed to you. Take any new medicine only after you have completely understood and accepted all the possible adverse reactions/side effects.  Wear Seat belts while driving. You were cared for by a hospitalist during your hospital stay. If you have any questions about your discharge medications or the care you received while you were in the hospital after you are discharged, you can call the unit and ask to speak with the hospitalist or the covering physician. Once you are discharged, your primary care physician will handle any further medical issues. Please note that NO REFILLS for any discharge medications will be authorized once you are discharged, as it is imperative that you return to your primary care physician (or establish a relationship with a primary care physician if you do not have one).   Increase activity slowly   Complete by: As directed         Allergies as of 01/28/2023   No Known Allergies      Medication List     TAKE these medications    lacosamide 50 MG Tabs tablet Commonly known as: VIMPAT Take 1 tablet (50 mg total) by mouth 2 (two) times daily.        Discharge Instructions/ Education: An After Visit Summary was printed and given to the patient. Discharge instructions given to patient/family with verbalized understanding. Discharge education completed with patient/family including: follow up instructions, medication list, discharge activities, and  limitations if indicated.  Additional discharge instructions as indicated by discharging provider also reviewed.  Patient and family able to verbalize understanding, all questions fully answered. Patient instructed to return to Emergency Department, call 911, or call MD for any changes in condition.   Patient escorted via wheelchair to lobby and discharged home via private automobile.

## 2023-01-28 NOTE — Progress Notes (Signed)
EEG complete - results pending.  GMD/NW 
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
## 2023-01-29 ENCOUNTER — Telehealth: Payer: Self-pay

## 2023-01-29 NOTE — Transitions of Care (Post Inpatient/ED Visit) (Unsigned)
   01/29/2023  Name: Thomas Carter MRN: [id number] DOB: 1995-09-28  Today's TOC FU Call Status: Today's TOC FU Call Status:: Unsuccessful Call (1st Attempt) Unsuccessful Call (1st Attempt) Date: 01/29/23  Attempted to reach the patient regarding the most recent Inpatient/ED visit.  Follow Up Plan: Additional outreach attempts will be made to reach the patient to complete the Transitions of Care (Post Inpatient/ED visit) call.   Signature Karena Addison, LPN [facility] Nurse Health Advisor Direct Dial [phone]

## 2023-01-30 NOTE — Transitions of Care (Post Inpatient/ED Visit) (Signed)
   01/30/2023  Name: Thomas Carter MRN: [id number] DOB: 10-18-1995  Today's TOC FU Call Status: Today's TOC FU Call Status:: Unsuccessful Call (3rd Attempt) Unsuccessful Call (1st Attempt) Date: 01/29/23 Unsuccessful Call (2nd Attempt) Date: 01/30/23 Unsuccessful Call (3rd Attempt) Date: 01/30/23  Attempted to reach the patient regarding the most recent Inpatient/ED visit.  Follow Up Plan: No further outreach attempts will be made at this time. We have been unable to contact the patient.  Signature Karena Addison, LPN [facility] Nurse Health Advisor Direct Dial [phone]

## 2023-01-30 NOTE — Transitions of Care (Post Inpatient/ED Visit) (Signed)
   01/30/2023  Name: ARLINGTON LONSWAY MRN: [id number] DOB: 1995-11-26  Today's TOC FU Call Status: Today's TOC FU Call Status:: Unsuccessful Call (2nd Attempt) Unsuccessful Call (1st Attempt) Date: 01/29/23 Unsuccessful Call (2nd Attempt) Date: 01/30/23  Attempted to reach the patient regarding the most recent Inpatient/ED visit.  Follow Up Plan: Additional outreach attempts will be made to reach the patient to complete the Transitions of Care (Post Inpatient/ED visit) call.   Signature Karena Addison, LPN [facility] Nurse Health Advisor Direct Dial [phone]

## 2023-02-03 ENCOUNTER — Emergency Department (HOSPITAL_COMMUNITY): Payer: Medicaid Other

## 2023-02-03 ENCOUNTER — Institutional Professional Consult (permissible substitution): Payer: Medicaid Other | Admitting: Diagnostic Neuroimaging

## 2023-02-03 ENCOUNTER — Other Ambulatory Visit: Payer: Self-pay

## 2023-02-03 ENCOUNTER — Telehealth: Payer: Self-pay | Admitting: Diagnostic Neuroimaging

## 2023-02-03 ENCOUNTER — Inpatient Hospital Stay (HOSPITAL_COMMUNITY)
Admission: EM | Admit: 2023-02-03 | Discharge: 2023-02-05 | DRG: 282 | Disposition: A | Discharge status: Home / Self Care | Payer: Medicaid Other | Attending: Family Medicine | Admitting: Family Medicine

## 2023-02-03 DIAGNOSIS — IMO0001 Reserved for inherently not codable concepts without codable children: Secondary | ICD-10-CM | POA: Diagnosis present

## 2023-02-03 DIAGNOSIS — F17200 Nicotine dependence, unspecified, uncomplicated: Secondary | ICD-10-CM | POA: Diagnosis present

## 2023-02-03 DIAGNOSIS — R569 Unspecified convulsions: Secondary | ICD-10-CM | POA: Diagnosis not present

## 2023-02-03 DIAGNOSIS — F17290 Nicotine dependence, other tobacco product, uncomplicated: Secondary | ICD-10-CM | POA: Diagnosis present

## 2023-02-03 DIAGNOSIS — Z604 Social exclusion and rejection: Secondary | ICD-10-CM | POA: Diagnosis present

## 2023-02-03 DIAGNOSIS — Z5982 Transportation insecurity: Secondary | ICD-10-CM

## 2023-02-03 DIAGNOSIS — R109 Unspecified abdominal pain: Secondary | ICD-10-CM | POA: Diagnosis not present

## 2023-02-03 DIAGNOSIS — I214 Non-ST elevation (NSTEMI) myocardial infarction: Secondary | ICD-10-CM | POA: Diagnosis not present

## 2023-02-03 DIAGNOSIS — I10 Essential (primary) hypertension: Secondary | ICD-10-CM | POA: Diagnosis not present

## 2023-02-03 DIAGNOSIS — Z6833 Body mass index (BMI) 33.0-33.9, adult: Secondary | ICD-10-CM

## 2023-02-03 DIAGNOSIS — F1490 Cocaine use, unspecified, uncomplicated: Secondary | ICD-10-CM | POA: Diagnosis present

## 2023-02-03 DIAGNOSIS — Z716 Tobacco abuse counseling: Secondary | ICD-10-CM

## 2023-02-03 DIAGNOSIS — G40909 Epilepsy, unspecified, not intractable, without status epilepticus: Secondary | ICD-10-CM | POA: Diagnosis not present

## 2023-02-03 DIAGNOSIS — Z87820 Personal history of traumatic brain injury: Secondary | ICD-10-CM | POA: Diagnosis not present

## 2023-02-03 DIAGNOSIS — D72829 Elevated white blood cell count, unspecified: Secondary | ICD-10-CM | POA: Diagnosis not present

## 2023-02-03 DIAGNOSIS — J45909 Unspecified asthma, uncomplicated: Secondary | ICD-10-CM | POA: Diagnosis present

## 2023-02-03 DIAGNOSIS — Z833 Family history of diabetes mellitus: Secondary | ICD-10-CM

## 2023-02-03 DIAGNOSIS — R7989 Other specified abnormal findings of blood chemistry: Principal | ICD-10-CM | POA: Diagnosis present

## 2023-02-03 DIAGNOSIS — F1410 Cocaine abuse, uncomplicated: Secondary | ICD-10-CM

## 2023-02-03 DIAGNOSIS — R079 Chest pain, unspecified: Secondary | ICD-10-CM | POA: Diagnosis present

## 2023-02-03 DIAGNOSIS — Z79899 Other long term (current) drug therapy: Secondary | ICD-10-CM

## 2023-02-03 DIAGNOSIS — E669 Obesity, unspecified: Secondary | ICD-10-CM | POA: Diagnosis present

## 2023-02-03 DIAGNOSIS — Z8269 Family history of other diseases of the musculoskeletal system and connective tissue: Secondary | ICD-10-CM

## 2023-02-03 DIAGNOSIS — F17210 Nicotine dependence, cigarettes, uncomplicated: Secondary | ICD-10-CM | POA: Diagnosis present

## 2023-02-03 DIAGNOSIS — Z91148 Patient's other noncompliance with medication regimen for other reason: Secondary | ICD-10-CM

## 2023-02-03 DIAGNOSIS — Z809 Family history of malignant neoplasm, unspecified: Secondary | ICD-10-CM

## 2023-02-03 DIAGNOSIS — G40409 Other generalized epilepsy and epileptic syndromes, not intractable, without status epilepticus: Secondary | ICD-10-CM | POA: Diagnosis present

## 2023-02-03 DIAGNOSIS — Z555 Less than a high school diploma: Secondary | ICD-10-CM

## 2023-02-03 HISTORY — DX: Cocaine abuse, uncomplicated: F14.10

## 2023-02-03 LAB — BASIC METABOLIC PANEL
Anion gap: 13 (ref 5–15)
BUN: 11 mg/dL (ref 6–20)
CO2: 20 mmol/L — ABNORMAL LOW (ref 22–32)
Calcium: 9.3 mg/dL (ref 8.9–10.3)
Chloride: 103 mmol/L (ref 98–111)
Creatinine, Ser: 1.4 mg/dL — ABNORMAL HIGH (ref 0.61–1.24)
GFR, Estimated: >60 mL/min (ref >60)
Glucose, Bld: 99 mg/dL (ref 70–99)
Potassium: 3.8 mmol/L (ref 3.5–5.1)
Sodium: 136 mmol/L (ref 135–145)

## 2023-02-03 LAB — LIPID PANEL
Cholesterol: 165 mg/dL (ref 0–200)
HDL: 55 mg/dL (ref >40)
LDL Cholesterol: 103 mg/dL — ABNORMAL HIGH (ref 0–99)
Total CHOL/HDL Ratio: 3 {ratio}
Triglycerides: 34 mg/dL (ref <150)
VLDL: 7 mg/dL (ref 0–40)

## 2023-02-03 LAB — CBC
HCT: 44.7 % (ref 39.0–52.0)
Hemoglobin: 15.4 g/dL (ref 13.0–17.0)
MCH: 29.7 pg (ref 26.0–34.0)
MCHC: 34.5 g/dL (ref 30.0–36.0)
MCV: 86.3 fL (ref 80.0–100.0)
Platelets: 277 10*3/uL (ref 150–400)
RBC: 5.18 MIL/uL (ref 4.22–5.81)
RDW: 13.2 % (ref 11.5–15.5)
WBC: 11.7 10*3/uL — ABNORMAL HIGH (ref 4.0–10.5)
nRBC: 0 % (ref 0.0–0.2)

## 2023-02-03 LAB — TROPONIN I (HIGH SENSITIVITY)
Troponin I (High Sensitivity): 30 ng/L — ABNORMAL HIGH (ref <18)
Troponin I (High Sensitivity): 526 ng/L (ref <18)
Troponin I (High Sensitivity): 4146 ng/L (ref <18)
Troponin I (High Sensitivity): 7514 ng/L (ref <18)
Troponin I (High Sensitivity): 12787 ng/L (ref <18)
Troponin I (High Sensitivity): 15228 ng/L (ref <18)

## 2023-02-03 LAB — HIV ANTIBODY (ROUTINE TESTING W REFLEX): HIV Screen 4th Generation wRfx: NONREACTIVE

## 2023-02-03 LAB — RAPID URINE DRUG SCREEN, HOSP PERFORMED
Amphetamines: NOT DETECTED
Barbiturates: NOT DETECTED
Benzodiazepines: NOT DETECTED
Cocaine: POSITIVE — AB
Opiates: POSITIVE — AB
Tetrahydrocannabinol: NOT DETECTED

## 2023-02-03 LAB — CK: Total CK: 1009 U/L — ABNORMAL HIGH (ref 49–397)

## 2023-02-03 MED ORDER — HEPARIN SODIUM (PORCINE) 5000 UNIT/ML IJ SOLN: 5000.0000 [IU] | Freq: Once | INTRAMUSCULAR | Status: DC

## 2023-02-03 MED ORDER — SODIUM CHLORIDE 0.9 % IV SOLN: INTRAVENOUS | Status: DC

## 2023-02-03 MED ORDER — ACETAMINOPHEN 325 MG PO TABS: 650.0000 mg | ORAL_TABLET | Freq: Four times a day (QID) | ORAL | Status: DC | PRN

## 2023-02-03 MED ORDER — IOHEXOL 350 MG/ML SOLN
100.0000 mL | Freq: Once | INTRAVENOUS | Status: AC | PRN
  Administered 2023-02-03: 100 mL via INTRAVENOUS

## 2023-02-03 MED ORDER — LACOSAMIDE 50 MG PO TABS
50.0000 mg | ORAL_TABLET | Freq: Two times a day (BID) | ORAL | Status: DC
  Administered 2023-02-03 – 2023-02-05 (×4): 50 mg via ORAL
  Filled 2023-02-03 (×4): qty 1

## 2023-02-03 MED ORDER — SODIUM CHLORIDE 0.9 % IV SOLN
200.0000 mg | Freq: Once | INTRAVENOUS | Status: AC
  Administered 2023-02-03: 200 mg via INTRAVENOUS
  Filled 2023-02-03: qty 20

## 2023-02-03 MED ORDER — ONDANSETRON HCL 4 MG/2ML IJ SOLN: 4.0000 mg | Freq: Four times a day (QID) | INTRAMUSCULAR | Status: DC | PRN

## 2023-02-03 MED ORDER — HEPARIN BOLUS VIA INFUSION
4000.0000 [IU] | Freq: Once | INTRAVENOUS | Status: AC
  Administered 2023-02-03: 4000 [IU] via INTRAVENOUS
  Filled 2023-02-03: qty 4000

## 2023-02-03 MED ORDER — ONDANSETRON HCL 4 MG PO TABS: 4.0000 mg | ORAL_TABLET | Freq: Four times a day (QID) | ORAL | Status: DC | PRN

## 2023-02-03 MED ORDER — MORPHINE SULFATE (PF) 2 MG/ML IV SOLN
4.0000 mg | Freq: Once | INTRAVENOUS | Status: AC
  Administered 2023-02-03: 4 mg via INTRAVENOUS
  Filled 2023-02-03: qty 2

## 2023-02-03 MED ORDER — ALBUTEROL SULFATE (2.5 MG/3ML) 0.083% IN NEBU: 2.5000 mg | INHALATION_SOLUTION | Freq: Four times a day (QID) | RESPIRATORY_TRACT | Status: DC | PRN

## 2023-02-03 MED ORDER — SODIUM CHLORIDE 0.9% FLUSH
3.0000 mL | Freq: Two times a day (BID) | INTRAVENOUS | Status: DC
  Administered 2023-02-03: 3 mL via INTRAVENOUS

## 2023-02-03 MED ORDER — HEPARIN (PORCINE) 25000 UT/250ML-% IV SOLN: 12.0000 [IU]/kg/h | INTRAVENOUS | Status: DC

## 2023-02-03 MED ORDER — ACETAMINOPHEN 650 MG RE SUPP: 650.0000 mg | Freq: Four times a day (QID) | RECTAL | Status: DC | PRN

## 2023-02-03 MED ORDER — HEPARIN (PORCINE) 25000 UT/250ML-% IV SOLN
1450.0000 [IU]/h | INTRAVENOUS | Status: DC
  Administered 2023-02-03 – 2023-02-04 (×2): 1500 [IU]/h via INTRAVENOUS
  Filled 2023-02-03 (×2): qty 250

## 2023-02-03 NOTE — ED Triage Notes (Signed)
Patient from home via EMS for eval of sudden onset of sharp central chest pain when getting in the shower this morning. 324 ASA given by EMS. Also reports having a seizure this morning with hx of same. Started on Vimpat 2 days ago. States when he took the vimpat this morning is when his chest started hurting.

## 2023-02-03 NOTE — ED Provider Triage Note (Signed)
Emergency Medicine Provider Triage Evaluation Note  Thomas Carter , a 28 y.o. male  was evaluated in triage.  Pt complains of severe sudden onset of chest pain in the center of his chest.  No pleuritic component and not reproducible with palpation.  Started this morning after he got in the shower.  Patient also noted to appear uncomfortable and hypertensive.  Does have a seizure history recently started on Vimpat reports he had 2 seizures while he was sleeping in his bed this morning which his mom told him about but denies any pain when he initially woke up.  This pain started when he got in the shower  Review of Systems  Positive: Chest pain Negative: Fever, vomiting, abdominal pain, numbness in the arms or legs, cough  Physical Exam  BP (!) 151/97 (BP Location: Right Arm)   Pulse 88   Temp 98.1 F (36.7 C) (Oral)   Resp (!) 22   Ht 6\' 3"  (1.905 m)   Wt 124.7 kg   SpO2 100% Comment: Simultaneous filing. User may not have seen previous data.  BMI 34.37 kg/m  Gen:   Appears uncomfortable Resp:  Normal effort clear bilaterally, pain not reproducible with palpation MSK:   Moves extremities without difficulty  Other:  No abdominal pain  Medical Decision Making  Medically screening exam initiated at 11:06 AM.  Appropriate orders placed.  RUPERTO KIERNAN was informed that the remainder of the evaluation will be completed by another provider, this initial triage assessment does not replace that evaluation, and the importance of remaining in the ED until their evaluation is complete.  Concern for dissection versus pneumothorax chest x-ray without pneumothorax.  EKG without dense of classic sT elevation consistent with stemi   Gwyneth Sprout, MD 02/03/23 1108
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
## 2023-02-03 NOTE — Telephone Encounter (Signed)
Pt's mom states pt had a seizure last night and another this morning. She is concerned because they have been happening more than usual. Requesting call back
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
## 2023-02-03 NOTE — Telephone Encounter (Signed)
Called the patient and was able to offer him a sooner apt with Dr Marjory Lies for today at 11 am and check in 10:30 am. Pt verbalized understanding.
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
## 2023-02-03 NOTE — ED Notes (Signed)
Trop 526, ED PA notified,  charge RN notified, Dr. Doran Durand notified at this time.
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
## 2023-02-03 NOTE — Progress Notes (Signed)
[facility] night cross cover note:   I was notified by RN of the patient's continued elevation in troponin, most recently from 4100 to 7500.  Per chart review, cardiology has been consulted today for the patient's elevated troponin, recommending continuation of heparin drip, troponin trending, as well as echocardiogram.  These patient is on heparin drip at this time, I have confirmed trending of troponin as well as echocardiogram in the morning.  Will also obtain updated EKG at this time.  Most recent vital signs appear stable, including afebrile, with heart rates in the 80s to low 100s, and systolic blood pressures in the 140s with respiratory rate 18 and oxygen saturation 100% on room air.  Patient without any chest pain at this time.   Will otherwise continue plan as outlined by cardiology.    Newton Pigg, DO Hospitalist

## 2023-02-04 ENCOUNTER — Encounter (HOSPITAL_COMMUNITY): Payer: Self-pay | Admitting: Internal Medicine

## 2023-02-04 ENCOUNTER — Encounter (HOSPITAL_COMMUNITY)
Admission: EM | Disposition: A | Discharge status: Home / Self Care | Payer: Self-pay | Source: Home / Self Care | Attending: Internal Medicine

## 2023-02-04 ENCOUNTER — Observation Stay (HOSPITAL_COMMUNITY): Payer: Medicaid Other

## 2023-02-04 DIAGNOSIS — I214 Non-ST elevation (NSTEMI) myocardial infarction: Principal | ICD-10-CM | POA: Diagnosis present

## 2023-02-04 DIAGNOSIS — J45909 Unspecified asthma, uncomplicated: Secondary | ICD-10-CM | POA: Diagnosis not present

## 2023-02-04 DIAGNOSIS — Z79899 Other long term (current) drug therapy: Secondary | ICD-10-CM | POA: Diagnosis not present

## 2023-02-04 DIAGNOSIS — Z6833 Body mass index (BMI) 33.0-33.9, adult: Secondary | ICD-10-CM | POA: Diagnosis not present

## 2023-02-04 DIAGNOSIS — R079 Chest pain, unspecified: Secondary | ICD-10-CM

## 2023-02-04 DIAGNOSIS — Z91148 Patient's other noncompliance with medication regimen for other reason: Secondary | ICD-10-CM | POA: Diagnosis not present

## 2023-02-04 DIAGNOSIS — Z716 Tobacco abuse counseling: Secondary | ICD-10-CM | POA: Diagnosis not present

## 2023-02-04 DIAGNOSIS — F17290 Nicotine dependence, other tobacco product, uncomplicated: Secondary | ICD-10-CM | POA: Diagnosis not present

## 2023-02-04 DIAGNOSIS — G40409 Other generalized epilepsy and epileptic syndromes, not intractable, without status epilepticus: Secondary | ICD-10-CM | POA: Diagnosis not present

## 2023-02-04 DIAGNOSIS — Z555 Less than a high school diploma: Secondary | ICD-10-CM | POA: Diagnosis not present

## 2023-02-04 DIAGNOSIS — Z5982 Transportation insecurity: Secondary | ICD-10-CM | POA: Diagnosis not present

## 2023-02-04 DIAGNOSIS — Z833 Family history of diabetes mellitus: Secondary | ICD-10-CM | POA: Diagnosis not present

## 2023-02-04 DIAGNOSIS — R7989 Other specified abnormal findings of blood chemistry: Secondary | ICD-10-CM

## 2023-02-04 DIAGNOSIS — I10 Essential (primary) hypertension: Secondary | ICD-10-CM | POA: Diagnosis not present

## 2023-02-04 DIAGNOSIS — E669 Obesity, unspecified: Secondary | ICD-10-CM | POA: Diagnosis not present

## 2023-02-04 DIAGNOSIS — Z809 Family history of malignant neoplasm, unspecified: Secondary | ICD-10-CM | POA: Diagnosis not present

## 2023-02-04 DIAGNOSIS — F17210 Nicotine dependence, cigarettes, uncomplicated: Secondary | ICD-10-CM | POA: Diagnosis not present

## 2023-02-04 DIAGNOSIS — F1410 Cocaine abuse, uncomplicated: Secondary | ICD-10-CM | POA: Diagnosis present

## 2023-02-04 DIAGNOSIS — R109 Unspecified abdominal pain: Secondary | ICD-10-CM | POA: Diagnosis not present

## 2023-02-04 DIAGNOSIS — D72829 Elevated white blood cell count, unspecified: Secondary | ICD-10-CM | POA: Diagnosis not present

## 2023-02-04 DIAGNOSIS — Z8269 Family history of other diseases of the musculoskeletal system and connective tissue: Secondary | ICD-10-CM | POA: Diagnosis not present

## 2023-02-04 DIAGNOSIS — Z604 Social exclusion and rejection: Secondary | ICD-10-CM | POA: Diagnosis not present

## 2023-02-04 DIAGNOSIS — Z87820 Personal history of traumatic brain injury: Secondary | ICD-10-CM | POA: Diagnosis not present

## 2023-02-04 HISTORY — PX: LEFT HEART CATH AND CORONARY ANGIOGRAPHY: CATH118249

## 2023-02-04 HISTORY — DX: Non-ST elevation (NSTEMI) myocardial infarction: I21.4

## 2023-02-04 LAB — ECHOCARDIOGRAM COMPLETE
AR max vel: 3.01 cm2
AV Area VTI: 2.94 cm2
AV Area mean vel: 2.91 cm2
AV Mean grad: 2 mm[Hg]
AV Peak grad: 3.7 mm[Hg]
Ao pk vel: 0.97 m/s
Area-P 1/2: 3.68 cm2
Height: 75 in
S' Lateral: 3.3 cm
Weight: 4400 [oz_av]

## 2023-02-04 LAB — HEPARIN LEVEL (UNFRACTIONATED)
Heparin Unfractionated: 0.62 [IU]/mL (ref 0.30–0.70)
Heparin Unfractionated: 0.64 [IU]/mL (ref 0.30–0.70)

## 2023-02-04 LAB — BASIC METABOLIC PANEL
Anion gap: 9 (ref 5–15)
BUN: 10 mg/dL (ref 6–20)
CO2: 25 mmol/L (ref 22–32)
Calcium: 8.6 mg/dL — ABNORMAL LOW (ref 8.9–10.3)
Chloride: 102 mmol/L (ref 98–111)
Creatinine, Ser: 1.42 mg/dL — ABNORMAL HIGH (ref 0.61–1.24)
GFR, Estimated: >60 mL/min (ref >60)
Glucose, Bld: 103 mg/dL — ABNORMAL HIGH (ref 70–99)
Potassium: 3.7 mmol/L (ref 3.5–5.1)
Sodium: 136 mmol/L (ref 135–145)

## 2023-02-04 LAB — CBC
HCT: 41.1 % (ref 39.0–52.0)
Hemoglobin: 14.3 g/dL (ref 13.0–17.0)
MCH: 30.6 pg (ref 26.0–34.0)
MCHC: 34.8 g/dL (ref 30.0–36.0)
MCV: 87.8 fL (ref 80.0–100.0)
Platelets: 245 10*3/uL (ref 150–400)
RBC: 4.68 MIL/uL (ref 4.22–5.81)
RDW: 13.4 % (ref 11.5–15.5)
WBC: 8 10*3/uL (ref 4.0–10.5)
nRBC: 0 % (ref 0.0–0.2)

## 2023-02-04 LAB — TROPONIN I (HIGH SENSITIVITY)
Troponin I (High Sensitivity): 11752 ng/L (ref <18)
Troponin I (High Sensitivity): 7108 ng/L (ref <18)

## 2023-02-04 SURGERY — LEFT HEART CATH AND CORONARY ANGIOGRAPHY
Anesthesia: LOCAL

## 2023-02-04 MED ORDER — LIDOCAINE HCL (PF) 1 % IJ SOLN
INTRAMUSCULAR | Status: AC
  Filled 2023-02-04: qty 30

## 2023-02-04 MED ORDER — IOHEXOL 350 MG/ML SOLN
INTRAVENOUS | Status: DC | PRN
  Administered 2023-02-04: 35 mL

## 2023-02-04 MED ORDER — VERAPAMIL HCL 2.5 MG/ML IV SOLN
INTRAVENOUS | Status: DC | PRN
  Administered 2023-02-04: 10 mL via INTRA_ARTERIAL

## 2023-02-04 MED ORDER — FENTANYL CITRATE (PF) 100 MCG/2ML IJ SOLN
INTRAMUSCULAR | Status: AC
  Filled 2023-02-04: qty 2

## 2023-02-04 MED ORDER — ASPIRIN 81 MG PO CHEW
81.0000 mg | CHEWABLE_TABLET | ORAL | Status: AC
  Administered 2023-02-04: 81 mg via ORAL
  Filled 2023-02-04: qty 1

## 2023-02-04 MED ORDER — LIDOCAINE HCL (PF) 1 % IJ SOLN
INTRAMUSCULAR | Status: DC | PRN
  Administered 2023-02-04: 2 mL

## 2023-02-04 MED ORDER — SODIUM CHLORIDE 0.9 % IV SOLN: INTRAVENOUS | Status: AC

## 2023-02-04 MED ORDER — FENTANYL CITRATE (PF) 100 MCG/2ML IJ SOLN
INTRAMUSCULAR | Status: DC | PRN
  Administered 2023-02-04: 50 ug via INTRAVENOUS

## 2023-02-04 MED ORDER — MIDAZOLAM HCL 2 MG/2ML IJ SOLN
INTRAMUSCULAR | Status: AC
  Filled 2023-02-04: qty 2

## 2023-02-04 MED ORDER — HYDRALAZINE HCL 20 MG/ML IJ SOLN: 10.0000 mg | INTRAMUSCULAR | Status: AC | PRN

## 2023-02-04 MED ORDER — HEPARIN (PORCINE) IN NACL 1000-0.9 UT/500ML-% IV SOLN
INTRAVENOUS | Status: DC | PRN
  Administered 2023-02-04 (×2): 500 mL

## 2023-02-04 MED ORDER — ROSUVASTATIN CALCIUM 20 MG PO TABS
20.0000 mg | ORAL_TABLET | Freq: Every day | ORAL | Status: DC
  Administered 2023-02-04 – 2023-02-05 (×2): 20 mg via ORAL
  Filled 2023-02-04 (×2): qty 1

## 2023-02-04 MED ORDER — SODIUM CHLORIDE 0.9% FLUSH: 3.0000 mL | INTRAVENOUS | Status: DC | PRN

## 2023-02-04 MED ORDER — ASPIRIN 81 MG PO TBEC
81.0000 mg | DELAYED_RELEASE_TABLET | Freq: Every day | ORAL | Status: DC
  Filled 2023-02-04: qty 1

## 2023-02-04 MED ORDER — SODIUM CHLORIDE 0.9 % WEIGHT BASED INFUSION
1.0000 mL/kg/h | INTRAVENOUS | Status: DC
  Administered 2023-02-04: 1 mL/kg/h via INTRAVENOUS

## 2023-02-04 MED ORDER — SODIUM CHLORIDE 0.9 % WEIGHT BASED INFUSION
3.0000 mL/kg/h | INTRAVENOUS | Status: AC
  Administered 2023-02-04: 3 mL/kg/h via INTRAVENOUS

## 2023-02-04 MED ORDER — HYDROCODONE-ACETAMINOPHEN 5-325 MG PO TABS
2.0000 | ORAL_TABLET | Freq: Four times a day (QID) | ORAL | Status: DC | PRN
  Administered 2023-02-04: 2 via ORAL
  Filled 2023-02-04: qty 2

## 2023-02-04 MED ORDER — SODIUM CHLORIDE 0.9% FLUSH
3.0000 mL | Freq: Two times a day (BID) | INTRAVENOUS | Status: DC
  Administered 2023-02-04: 3 mL via INTRAVENOUS

## 2023-02-04 MED ORDER — SODIUM CHLORIDE 0.9 % IV SOLN: 250.0000 mL | INTRAVENOUS | Status: DC | PRN

## 2023-02-04 MED ORDER — MIDAZOLAM HCL 2 MG/2ML IJ SOLN
INTRAMUSCULAR | Status: DC | PRN
  Administered 2023-02-04: 2 mg via INTRAVENOUS

## 2023-02-04 MED ORDER — ASPIRIN 81 MG PO TBEC
81.0000 mg | DELAYED_RELEASE_TABLET | Freq: Every day | ORAL | Status: DC
  Administered 2023-02-05: 81 mg via ORAL
  Filled 2023-02-04: qty 1

## 2023-02-04 MED ORDER — LABETALOL HCL 5 MG/ML IV SOLN: 10.0000 mg | INTRAVENOUS | Status: AC | PRN

## 2023-02-04 MED ORDER — HEPARIN SODIUM (PORCINE) 1000 UNIT/ML IJ SOLN
INTRAMUSCULAR | Status: DC | PRN
  Administered 2023-02-04: 6000 [IU] via INTRAVENOUS

## 2023-02-04 SURGICAL SUPPLY — 10 items
CATH INFINITI 5 FR JL3.5 (CATHETERS) IMPLANT
CATH INFINITI JR4 5F (CATHETERS) IMPLANT
DEVICE RAD COMP TR BAND LRG (VASCULAR PRODUCTS) IMPLANT
GLIDESHEATH SLEND SS 6F .021 (SHEATH) IMPLANT
GUIDEWIRE INQWIRE 1.5J.035X260 (WIRE) IMPLANT
INQWIRE 1.5J .035X260CM (WIRE) ×1
KIT SINGLE USE MANIFOLD (KITS) IMPLANT
PACK CARDIAC CATHETERIZATION (CUSTOM PROCEDURE TRAY) ×2 IMPLANT
SET ATX-X65L (MISCELLANEOUS) IMPLANT
SHEATH PROBE COVER 6X72 (BAG) IMPLANT

## 2023-02-04 NOTE — ED Notes (Signed)
Dr. Allena Katz at bedside

## 2023-02-04 NOTE — ED Notes (Signed)
Pt removed cardiac leads; leads placed back on pt, education provided regarding importance of continued cardiac monitoring at this time

## 2023-02-04 NOTE — Interval H&P Note (Signed)
History and Physical Interval Note:  02/04/2023 4:13 PM  Thomas Carter  has presented today for surgery, with the diagnosis of chest pain.  The various methods of treatment have been discussed with the patient and family. After consideration of risks, benefits and other options for treatment, the patient has consented to  Procedure(s): LEFT HEART CATH AND CORONARY ANGIOGRAPHY (N/A)  All Percutaneous CORONARY INTERVENTION  as a surgical intervention.  The patient's history has been reviewed, patient examined, no change in status, stable for surgery.  I have reviewed the patient's chart and labs.  Questions were answered to the patient's satisfaction.    Cath Lab Visit (complete for each Cath Lab visit)  Clinical Evaluation Leading to the Procedure:   ACS: Yes.    Non-ACS:    Anginal Classification: CCS IV  Anti-ischemic medical therapy: Minimal Therapy (1 class of medications)  Non-Invasive Test Results: No non-invasive testing performed  Prior CABG: No previous CABG     Bryan Lemma

## 2023-02-04 NOTE — ED Notes (Signed)
Pt had removed cardiac leads again; pt a and o x 4; this RN reiterated importance of cardiac monitoring

## 2023-02-04 NOTE — Plan of Care (Signed)

## 2023-02-04 NOTE — Progress Notes (Signed)
[facility] Progress Note Patient: Thomas Carter YQI:[id number] DOB: Aug 07, 1995 DOA: 02/03/2023  DOS: the patient was seen and examined on 02/04/2023  Brief Hospital Course: PMH of TBI, seizures, substance abuse cocaine positive, active smoker, obesity.  Presented to the hospital with complaints of chest pain as well as reported seizures. Chest pain was reported as central chest pain sharp pressure-like sensation without any shortness of breath.  Currently resolved. Also had an episode of seizures, witnessed by mother.  Noncompliant with Vimpat. Neurology was consulted, recommended continuing home dose of Vimpat. Cardiology was consulted for chest pain and elevated troponin.  Patient was started on IV heparin.  Left heart catheter 1/28.  Assessment and Plan: Non-STEMI. Likely coronary vasospasm in the setting of cocaine use. Troponin peaked at 15,000. Trending down right now. EKG not remarkable for any acute ischemia. Appreciate cardiology consultation. Echocardiogram shows preserved EF and in 1 place mentions about no wall motion abnormality but in another place mentions posterior wall motion abnormality. Cardiology took the patient for cardiac catheterization on 1/28. Will monitor recommendation. Currently on IV heparin.  Recurrent seizures. Prior history of TBI. Noncompliance. Patient was seen and discharged on 1/21 for seizures. Seizures were thought secondary to history of TBI, prior history of seizures as well as ongoing use of cocaine and THC use as well as alcohol use. Discharged on Vimpat 50 mg twice daily. Reportedly was not taking Vimpat as recommended. Currently after discussion with neurology, loaded with Vimpat and is on home regimen of Vimpat again.  Substance abuse. Cocaine abuse. Patient reports that last use was 1/21. UDS positivity is not consistent with that. Monitor for now. Recommended patient to quit abusing cocaine.  Active smoker. Smokes half a pack  a day. Nicotine patch. Recommended to quit smoking.  Obesity. Body mass index is 33.78 kg/m.  Placing the patient at high risk for poor outcome.  Alcohol use. Patient denies any daily alcohol drinking. Denies any heavy alcohol use as well. But he did drink some tequila a few days ago. Recommend to avoid alcohol while still being treated for seizures.  Chronically elevated serum creatinine, no CKD. Serum creatinine 1.4 at baseline. BUN normal.  GFR more than 60. CK 1000.  Serum creatinine elevation mostly physiological. For now we will monitor after cardiac catheterization.  Will need to reiterate seizure precaution on discharge.    Subjective: Denies any chest pain.  No nausea no vomiting.  No focal deficit.  Physical Exam: General: in Mild distress, No Rash Cardiovascular: S1 and S2 Present, No Murmur Respiratory: Good respiratory effort, Bilateral Air entry present. No Crackles, No wheezes Abdomen: Bowel Sound present, No tenderness Extremities: No edema Neuro: Alert and oriented x3, no new focal deficit  Data Reviewed: I have Reviewed nursing notes, Vitals, and Lab results. Since last encounter, pertinent lab results CBC and BMP   . I have ordered test including CBC and BMP  . I have discussed pt's care plan and test results with cardiology  .   Disposition: Status is: Inpatient Remains inpatient appropriate because: Monitor renal function  Family Communication: No one at bedside Level of care: Telemetry Cardiac   Vitals:   02/04/23 1648 02/04/23 1653 02/04/23 1658 02/04/23 1719  BP: 133/66 137/79 137/79 (!) 128/92  Pulse: 78 79 (!) 0 72  Resp: 18 16  13   Temp:    97.6 F (36.4 C)  TempSrc:    Oral  SpO2: 96% 99% 100% 98%  Weight:      Height:  Author: Lynden Oxford, MD 02/04/2023 5:38 PM  Please look on [email or web address] to find out who is on call.

## 2023-02-04 NOTE — Progress Notes (Addendum)
Rounding Note    Patient Name: Thomas Carter Date of Encounter: 02/04/2023  [facility] Cardiologist: Dr. Royann Shivers (New)  Subjective   No significant overnight events. Chest pain has resolved. No shortness of breath. However, high-sensitivity troponin trended up to 15,000 overnight. Plan is for cardiac catheterization today.  Inpatient Medications    Scheduled Meds:  lacosamide  50 mg Oral BID   sodium chloride flush  3 mL Intravenous Q12H   Continuous Infusions:  sodium chloride 75 mL/hr at 02/04/23 0726   heparin 1,450 Units/hr (02/04/23 0957)   PRN Meds: acetaminophen **OR** acetaminophen, albuterol, ondansetron **OR** ondansetron (ZOFRAN) IV   Vital Signs    Vitals:   02/04/23 1004 02/04/23 1030 02/04/23 1200 02/04/23 1204  BP:   126/84   Pulse:  83 77   Resp:  19 18   Temp: 98.1 F (36.7 C)   97.7 F (36.5 C)  TempSrc: Oral   Oral  SpO2:  100% 100%   Weight:      Height:        Intake/Output Summary (Last 24 hours) at 02/04/2023 1310 Last data filed at 02/04/2023 0828 Gross per 24 hour  Intake --  Output 1100 ml  Net -1100 ml      02/03/2023   10:37 AM 01/28/2023    3:09 AM 01/22/2022    8:28 AM  Last 3 Weights  Weight (lbs) 275 lb 275 lb 292 lb 12.8 oz  Weight (kg) 124.739 kg 124.739 kg 132.813 kg      Telemetry    Normal sinus rhythm with rates in the 70s to 80s. - Personally Reviewed  ECG    No new ECG tracing today. - Personally Reviewed  Physical Exam   GEN: Obese African-American male in no acute distress.   Neck: No JVD. Cardiac: RRR. No murmurs, rubs, or gallops. Right radial pulse 2+ and equal. Respiratory: Clear to auscultation bilaterally. No wheezes, rhonchi, or rales. GI: Soft, non-distended, and non-tender. MS: No lower extremity edema. No deformity. Skin: Warm and dry. Neuro:  No focal deficits. Psych: Normal affect. Responds appropriately.  Labs    High Sensitivity Troponin:   Recent Labs  Lab  02/03/23 1824 02/03/23 2024 02/03/23 2224 02/04/23 0045 02/04/23 0629  TROPONINIHS 7,514* 12,787* 15,228* 11,752* 7,108*     Chemistry Recent Labs  Lab 02/03/23 1047 02/04/23 0629  NA 136 136  K 3.8 3.7  CL 103 102  CO2 20* 25  GLUCOSE 99 103*  BUN 11 10  CREATININE 1.40* 1.42*  CALCIUM 9.3 8.6*  GFRNONAA >60 >60  ANIONGAP 13 9    Lipids  Recent Labs  Lab 02/03/23 1824  CHOL 165  TRIG 34  HDL 55  LDLCALC 103*  CHOLHDL 3.0    Hematology Recent Labs  Lab 02/03/23 1047 02/04/23 0629  WBC 11.7* 8.0  RBC 5.18 4.68  HGB 15.4 14.3  HCT 44.7 41.1  MCV 86.3 87.8  MCH 29.7 30.6  MCHC 34.5 34.8  RDW 13.2 13.4  PLT 277 245   Thyroid No results for input(s): "TSH", "FREET4" in the last 168 hours.  BNPNo results for input(s): "BNP", "PROBNP" in the last 168 hours.  DDimer No results for input(s): "DDIMER" in the last 168 hours.   Radiology    ECHOCARDIOGRAM COMPLETE Result Date: 02/04/2023    ECHOCARDIOGRAM REPORT   Patient Name:   RAIDON SWANNER Date of Exam: 02/04/2023 Medical Rec #:  [id number]    Height:  75.0 in Accession #:    [id number]   Weight:       275.0 lb Date of Birth:  [birth year]   BSA:          2.512 m Patient Age:    28 years     BP:           121/71 mmHg Patient Gender: M            HR:           68 bpm. Exam Location:  Inpatient Procedure: 2D Echo, Cardiac Doppler and Color Doppler Indications:    Elevated Troponin  History:        Patient has no prior history of Echocardiogram examinations.                 Risk Factors:Hypertension.  Sonographer:    Darlys Gales Referring Phys: [id number] Jonita Albee IMPRESSIONS  1. Left ventricular ejection fraction, by estimation, is 60 to 65%. The left ventricle has normal function. The left ventricle has no regional wall motion abnormalities. Left ventricular diastolic parameters were normal.  2. Right ventricular systolic function is normal. The right ventricular size is normal.  3. The mitral valve is normal  in structure. Trivial mitral valve regurgitation. No evidence of mitral stenosis.  4. The aortic valve is normal in structure. Aortic valve regurgitation is not visualized. No aortic stenosis is present.  5. The inferior vena cava is dilated in size with <50% respiratory variability, suggesting right atrial pressure of 15 mmHg. FINDINGS  Left Ventricle: Left ventricular ejection fraction, by estimation, is 60 to 65%. The left ventricle has normal function. The left ventricle has no regional wall motion abnormalities. The left ventricular internal cavity size was normal in size. There is  no left ventricular hypertrophy. Left ventricular diastolic parameters were normal.  LV Wall Scoring: The posterior wall is hypokinetic. Right Ventricle: The right ventricular size is normal. No increase in right ventricular wall thickness. Right ventricular systolic function is normal. Left Atrium: Left atrial size was normal in size. Right Atrium: Right atrial size was normal in size. Pericardium: There is no evidence of pericardial effusion. Mitral Valve: The mitral valve is normal in structure. Trivial mitral valve regurgitation. No evidence of mitral valve stenosis. Tricuspid Valve: The tricuspid valve is normal in structure. Tricuspid valve regurgitation is trivial. No evidence of tricuspid stenosis. Aortic Valve: The aortic valve is normal in structure. Aortic valve regurgitation is not visualized. No aortic stenosis is present. Aortic valve mean gradient measures 2.0 mmHg. Aortic valve peak gradient measures 3.7 mmHg. Aortic valve area, by VTI measures 2.94 cm. Pulmonic Valve: The pulmonic valve was normal in structure. Pulmonic valve regurgitation is not visualized. No evidence of pulmonic stenosis. Aorta: The aortic root is normal in size and structure. Venous: The inferior vena cava is dilated in size with less than 50% respiratory variability, suggesting right atrial pressure of 15 mmHg. IAS/Shunts: No atrial level  shunt detected by color flow Doppler.  LEFT VENTRICLE PLAX 2D LVIDd:         5.10 cm   Diastology LVIDs:         3.30 cm   LV e' medial:    7.51 cm/s LV PW:         0.90 cm   LV E/e' medial:  9.3 LV IVS:        0.90 cm   LV e' lateral:   16.60 cm/s LVOT diam:     2.00  cm   LV E/e' lateral: 4.2 LV SV:         56 LV SV Index:   22 LVOT Area:     3.14 cm  RIGHT VENTRICLE RV S prime:     14.10 cm/s TAPSE (M-mode): 3.2 cm LEFT ATRIUM             Index        RIGHT ATRIUM           Index LA Vol (A2C):   40.2 ml 16.00 ml/m  RA Area:     15.00 cm LA Vol (A4C):   34.4 ml 13.69 ml/m  RA Volume:   35.20 ml  14.01 ml/m LA Biplane Vol: 38.3 ml 15.24 ml/m  AORTIC VALVE AV Area (Vmax):    3.01 cm AV Area (Vmean):   2.91 cm AV Area (VTI):     2.94 cm AV Vmax:           96.60 cm/s AV Vmean:          74.400 cm/s AV VTI:            0.190 m AV Peak Grad:      3.7 mmHg AV Mean Grad:      2.0 mmHg LVOT Vmax:         92.60 cm/s LVOT Vmean:        68.900 cm/s LVOT VTI:          0.178 m LVOT/AV VTI ratio: 0.94  AORTA Ao Root diam: 3.20 cm MITRAL VALVE MV Area (PHT): 3.68 cm    SHUNTS MV Decel Time: 206 msec    Systemic VTI:  0.18 m MV E velocity: 69.70 cm/s  Systemic Diam: 2.00 cm MV A velocity: 35.60 cm/s MV E/A ratio:  1.96 Arvilla Meres MD Electronically signed by Arvilla Meres MD Signature Date/Time: 02/04/2023/11:52:20 AM    Final    CT Angio Chest/Abd/Pel for Dissection W and/or Wo Contrast Result Date: 02/03/2023 CLINICAL DATA:  Acute aortic syndrome (AAS) suspected severe chest pain with hypertension and concern for dissection EXAM: CT ANGIOGRAPHY CHEST, ABDOMEN AND PELVIS TECHNIQUE: Non-contrast CT of the chest was initially obtained. Multidetector CT imaging through the chest, abdomen and pelvis was performed using the standard protocol during bolus administration of intravenous contrast. Multiplanar reconstructed images and MIPs were obtained and reviewed to evaluate the vascular anatomy. RADIATION DOSE  REDUCTION: This exam was performed according to the departmental dose-optimization program which includes automated exposure control, adjustment of the mA and/or kV according to patient size and/or use of iterative reconstruction technique. CONTRAST:  OMNIPAQUE IOHEXOL 350 MG/ML SOLN COMPARISON:  CT chest 04/21/2020 FINDINGS: CTA CHEST FINDINGS Cardiovascular: Preferential opacification of the thoracic aorta. No evidence of thoracic aortic aneurysm or dissection. Normal heart size. No significant pericardial effusion. No atherosclerotic plaque of the thoracic aorta. No coronary artery calcifications. Incidentally noted replaced left vertebral artery with origin off of the aortic arch. The main pulmonary artery is normal in caliber. No central or segmental pulmonary embolus. Mediastinum/Nodes: No enlarged mediastinal, hilar, or axillary lymph nodes. Thyroid gland, trachea, and esophagus demonstrate no significant findings. Lungs/Pleura: Peribronchovascular tree-in-bud nodularity of the lingula. No pulmonary nodule. No pulmonary mass. No pleural effusion. No pneumothorax. Musculoskeletal: Stable asymmetric left greater than right gynecomastia. No suspicious lytic or blastic osseous lesions. No acute displaced fracture. 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 Associated retained 4 mm shrapnel within the superficial subcutaneus soft tissues of the posterolateral lower chest wall. Review of  the MIP images confirms the above findings. CTA ABDOMEN AND PELVIS FINDINGS VASCULAR Aorta: Normal caliber aorta without aneurysm, dissection, vasculitis or significant stenosis. Celiac: Patent without evidence of aneurysm, dissection, vasculitis or significant stenosis. SMA: Patent without evidence of aneurysm, dissection, vasculitis or significant stenosis. Renals: Both renal arteries are patent without evidence of aneurysm, dissection, vasculitis, fibromuscular dysplasia or significant stenosis. IMA: Patent  without evidence of aneurysm, dissection, vasculitis or significant stenosis. Inflow: Patent without evidence of aneurysm, dissection, vasculitis or significant stenosis. Veins: No obvious venous abnormality within the limitations of this arterial phase study. Review of the MIP images confirms the above findings. NON-VASCULAR Hepatobiliary: No focal liver abnormality. No gallstones, gallbladder wall thickening, or pericholecystic fluid. No biliary dilatation. Pancreas: No focal lesion. Normal pancreatic contour. No surrounding inflammatory changes. No main pancreatic ductal dilatation. Spleen: Normal in size without focal abnormality. Adrenals/Urinary Tract: No adrenal nodule bilaterally. Bilateral kidneys enhance symmetrically. No hydronephrosis. No hydroureter. The urinary bladder is unremarkable. Stomach/Bowel: Stomach is within normal limits. No evidence of bowel wall thickening or dilatation. Appendix appears normal. Lymphatic: No lymphadenopathy. Reproductive: Prostate is unremarkable. Other: No intraperitoneal free fluid. No intraperitoneal free gas. No organized fluid collection. Musculoskeletal: No abdominal wall hernia or abnormality. No suspicious lytic or blastic osseous lesions. No acute displaced fracture. Review of the MIP images confirms the above findings. IMPRESSION: 1. Peribronchovascular tree-in-bud nodularity of the lingula. Findings suggestive of developing atypical infection/inflammation. 2. No acute thoracic or abdominal aorta abnormality. 3. No pulmonary embolus. 4. No acute intra-abdominal or intrapelvic abnormality. 5. Stable asymmetric left greater than right gynecomastia. Recommend correlation with physical exam. 6. Retained 4 mm shrapnel within the superficial subcutaneus soft tissues of the posterolateral lower chest wall. Electronically Signed   By: Tish Frederickson M.D.   On: 02/03/2023 12:32   DG Chest 2 View Result Date: 02/03/2023 CLINICAL DATA:  Chest pain. EXAM: CHEST - 2 VIEW  COMPARISON:  10/16/2022. FINDINGS: Bilateral lung fields are clear. Bilateral costophrenic angles are clear. Normal cardio-mediastinal silhouette. No acute osseous abnormalities. The soft tissues are within normal limits. IMPRESSION: No active cardiopulmonary disease. Electronically Signed   By: Jules Schick M.D.   On: 02/03/2023 11:19    Cardiac Studies   Echocardiogram 02/04/2023: Impressions:  1. Left ventricular ejection fraction, by estimation, is 60 to 65%. The  left ventricle has normal function. The left ventricle has no regional  wall motion abnormalities. Left ventricular diastolic parameters were  normal.   2. Right ventricular systolic function is normal. The right ventricular  size is normal.   3. The mitral valve is normal in structure. Trivial mitral valve  regurgitation. No evidence of mitral stenosis.   4. The aortic valve is normal in structure. Aortic valve regurgitation is  not visualized. No aortic stenosis is present.   5. The inferior vena cava is dilated in size with <50% respiratory  variability, suggesting right atrial pressure of 15 mmHg.    Patient Profile     28 y.o. male with a history of traumatic brain injury, seizures on Vimpat, and polysubstance abuse. He has recently had an increase in seizures and was recently hospitalized for this last week. He presented on 02/03/2023 with chest pain and seizures and ruled in for NSTEMI.  Assessment & Plan    NSTEMI Patient presented to the ED with chest pain that occurred after 2 seizures and then taking his seizure medications on an empty stomach. UDS was positive for cocaine and opiates. EKG showed showed some slight  ST elevation in precordial leads consistent with early repolarization. High-sensitivity initially 30 >> 56 and troponin elevation was felt to be due to recent seizures with vasospasms from cocaine possibly playing a role as well. However, high-sensitivity increased to 15,228 overnight and is now  down-trending. CK also elevated at 1,009. Echo showed LVEF of 60-65% with hypokinesis of the posterior wall.  - Patients states he feels much better than yesterday. Currently chest pain free. - Continue IV Heparin. - Will check fasting lipid panel and hemoglobin A1c in the morning. - Will start Aspirin 81mg  daily and Crestor 20mg  daily. Will hold off on beta-blocker given cocaine use. - Will proceed with cardiac catheterization later today. Patient last ate around 8am. Will make NPO now.  The patient understands that risks include but are not limited to stroke (1 in 1000), death (1 in 1000), kidney failure [usually temporary] (1 in 500), bleeding (1 in 200), allergic reaction [possibly serious] (1 in 200), and agrees to proceed.  Tobacco Abuse Cocaine Abuse UDS was positive for cocaine and opiates. Patient reported he last used cocaine 5 days ago. - He has been counseled on the importance of complete cessation of both tobacco and cocaine. Will need to continue to emphasize this.  Otherwise, per primary team: - TBI - Seizure disorder  For questions or updates, please contact [facility] Please consult [email or web address] for contact info under        Signed, Corrin Parker, PA-C  02/04/2023, 1:10 PM    I have seen and examined the patient along with Corrin Parker, PA-C .  I have reviewed the chart, notes and new data.  I agree with PA/NP's note.  Key new complaints: Currently asymptomatic without chest pain or shortness of her palpitations or any new neurological events Key examination changes: Normal cardiovascular exam Key new findings / data: Marked increase in high-sensitivity troponin overnight.  Suggestion of posterior wall hypokinesis on echocardiogram.  PLAN: Interval marked increase in troponin (which has peaked and is now declining) and possible new wall motion abnormality do raise concern for true coronary event, despite his young age.  Keep on heparin for now  and pursue coronary angiography.  The most likely cause remains demand myocardial injury from grand mal seizure and cocaine.  Informed Consent   Shared Decision Making/Informed Consent The risks [stroke (1 in 1000), death (1 in 1000), kidney failure [usually temporary] (1 in 500), bleeding (1 in 200), allergic reaction [possibly serious] (1 in 200)], benefits (diagnostic support and management of coronary artery disease) and alternatives of a cardiac catheterization were discussed in detail with Mr. Birdsell and he is willing to proceed.      Thurmon Fair, MD, [facility] [phone] 02/04/2023, 3:38 PM
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
## 2023-02-04 NOTE — H&P (View-Only) (Signed)
Rounding Note    Patient Name: Thomas Carter Date of Encounter: 02/04/2023  [facility] Cardiologist: Dr. Royann Shivers (New)  Subjective   No significant overnight events. Chest pain has resolved. No shortness of breath. However, high-sensitivity troponin trended up to 15,000 overnight. Plan is for cardiac catheterization today.  Inpatient Medications    Scheduled Meds:  lacosamide  50 mg Oral BID   sodium chloride flush  3 mL Intravenous Q12H   Continuous Infusions:  sodium chloride 75 mL/hr at 02/04/23 0726   heparin 1,450 Units/hr (02/04/23 0957)   PRN Meds: acetaminophen **OR** acetaminophen, albuterol, ondansetron **OR** ondansetron (ZOFRAN) IV   Vital Signs    Vitals:   02/04/23 1004 02/04/23 1030 02/04/23 1200 02/04/23 1204  BP:   126/84   Pulse:  83 77   Resp:  19 18   Temp: 98.1 F (36.7 C)   97.7 F (36.5 C)  TempSrc: Oral   Oral  SpO2:  100% 100%   Weight:      Height:        Intake/Output Summary (Last 24 hours) at 02/04/2023 1310 Last data filed at 02/04/2023 0828 Gross per 24 hour  Intake --  Output 1100 ml  Net -1100 ml      02/03/2023   10:37 AM 01/28/2023    3:09 AM 01/22/2022    8:28 AM  Last 3 Weights  Weight (lbs) 275 lb 275 lb 292 lb 12.8 oz  Weight (kg) 124.739 kg 124.739 kg 132.813 kg      Telemetry    Normal sinus rhythm with rates in the 70s to 80s. - Personally Reviewed  ECG    No new ECG tracing today. - Personally Reviewed  Physical Exam   GEN: Obese African-American male in no acute distress.   Neck: No JVD. Cardiac: RRR. No murmurs, rubs, or gallops. Right radial pulse 2+ and equal. Respiratory: Clear to auscultation bilaterally. No wheezes, rhonchi, or rales. GI: Soft, non-distended, and non-tender. MS: No lower extremity edema. No deformity. Skin: Warm and dry. Neuro:  No focal deficits. Psych: Normal affect. Responds appropriately.  Labs    High Sensitivity Troponin:   Recent Labs  Lab  02/03/23 1824 02/03/23 2024 02/03/23 2224 02/04/23 0045 02/04/23 0629  TROPONINIHS 7,514* 12,787* 15,228* 11,752* 7,108*     Chemistry Recent Labs  Lab 02/03/23 1047 02/04/23 0629  NA 136 136  K 3.8 3.7  CL 103 102  CO2 20* 25  GLUCOSE 99 103*  BUN 11 10  CREATININE 1.40* 1.42*  CALCIUM 9.3 8.6*  GFRNONAA >60 >60  ANIONGAP 13 9    Lipids  Recent Labs  Lab 02/03/23 1824  CHOL 165  TRIG 34  HDL 55  LDLCALC 103*  CHOLHDL 3.0    Hematology Recent Labs  Lab 02/03/23 1047 02/04/23 0629  WBC 11.7* 8.0  RBC 5.18 4.68  HGB 15.4 14.3  HCT 44.7 41.1  MCV 86.3 87.8  MCH 29.7 30.6  MCHC 34.5 34.8  RDW 13.2 13.4  PLT 277 245   Thyroid No results for input(s): "TSH", "FREET4" in the last 168 hours.  BNPNo results for input(s): "BNP", "PROBNP" in the last 168 hours.  DDimer No results for input(s): "DDIMER" in the last 168 hours.   Radiology    ECHOCARDIOGRAM COMPLETE Result Date: 02/04/2023    ECHOCARDIOGRAM REPORT   Patient Name:   RAIDON SWANNER Date of Exam: 02/04/2023 Medical Rec #:  [id number]    Height:  75.0 in Accession #:    [id number]   Weight:       275.0 lb Date of Birth:  [birth year]   BSA:          2.512 m Patient Age:    28 years     BP:           121/71 mmHg Patient Gender: M            HR:           68 bpm. Exam Location:  Inpatient Procedure: 2D Echo, Cardiac Doppler and Color Doppler Indications:    Elevated Troponin  History:        Patient has no prior history of Echocardiogram examinations.                 Risk Factors:Hypertension.  Sonographer:    Darlys Gales Referring Phys: [id number] Jonita Albee IMPRESSIONS  1. Left ventricular ejection fraction, by estimation, is 60 to 65%. The left ventricle has normal function. The left ventricle has no regional wall motion abnormalities. Left ventricular diastolic parameters were normal.  2. Right ventricular systolic function is normal. The right ventricular size is normal.  3. The mitral valve is normal  in structure. Trivial mitral valve regurgitation. No evidence of mitral stenosis.  4. The aortic valve is normal in structure. Aortic valve regurgitation is not visualized. No aortic stenosis is present.  5. The inferior vena cava is dilated in size with <50% respiratory variability, suggesting right atrial pressure of 15 mmHg. FINDINGS  Left Ventricle: Left ventricular ejection fraction, by estimation, is 60 to 65%. The left ventricle has normal function. The left ventricle has no regional wall motion abnormalities. The left ventricular internal cavity size was normal in size. There is  no left ventricular hypertrophy. Left ventricular diastolic parameters were normal.  LV Wall Scoring: The posterior wall is hypokinetic. Right Ventricle: The right ventricular size is normal. No increase in right ventricular wall thickness. Right ventricular systolic function is normal. Left Atrium: Left atrial size was normal in size. Right Atrium: Right atrial size was normal in size. Pericardium: There is no evidence of pericardial effusion. Mitral Valve: The mitral valve is normal in structure. Trivial mitral valve regurgitation. No evidence of mitral valve stenosis. Tricuspid Valve: The tricuspid valve is normal in structure. Tricuspid valve regurgitation is trivial. No evidence of tricuspid stenosis. Aortic Valve: The aortic valve is normal in structure. Aortic valve regurgitation is not visualized. No aortic stenosis is present. Aortic valve mean gradient measures 2.0 mmHg. Aortic valve peak gradient measures 3.7 mmHg. Aortic valve area, by VTI measures 2.94 cm. Pulmonic Valve: The pulmonic valve was normal in structure. Pulmonic valve regurgitation is not visualized. No evidence of pulmonic stenosis. Aorta: The aortic root is normal in size and structure. Venous: The inferior vena cava is dilated in size with less than 50% respiratory variability, suggesting right atrial pressure of 15 mmHg. IAS/Shunts: No atrial level  shunt detected by color flow Doppler.  LEFT VENTRICLE PLAX 2D LVIDd:         5.10 cm   Diastology LVIDs:         3.30 cm   LV e' medial:    7.51 cm/s LV PW:         0.90 cm   LV E/e' medial:  9.3 LV IVS:        0.90 cm   LV e' lateral:   16.60 cm/s LVOT diam:     2.00  cm   LV E/e' lateral: 4.2 LV SV:         56 LV SV Index:   22 LVOT Area:     3.14 cm  RIGHT VENTRICLE RV S prime:     14.10 cm/s TAPSE (M-mode): 3.2 cm LEFT ATRIUM             Index        RIGHT ATRIUM           Index LA Vol (A2C):   40.2 ml 16.00 ml/m  RA Area:     15.00 cm LA Vol (A4C):   34.4 ml 13.69 ml/m  RA Volume:   35.20 ml  14.01 ml/m LA Biplane Vol: 38.3 ml 15.24 ml/m  AORTIC VALVE AV Area (Vmax):    3.01 cm AV Area (Vmean):   2.91 cm AV Area (VTI):     2.94 cm AV Vmax:           96.60 cm/s AV Vmean:          74.400 cm/s AV VTI:            0.190 m AV Peak Grad:      3.7 mmHg AV Mean Grad:      2.0 mmHg LVOT Vmax:         92.60 cm/s LVOT Vmean:        68.900 cm/s LVOT VTI:          0.178 m LVOT/AV VTI ratio: 0.94  AORTA Ao Root diam: 3.20 cm MITRAL VALVE MV Area (PHT): 3.68 cm    SHUNTS MV Decel Time: 206 msec    Systemic VTI:  0.18 m MV E velocity: 69.70 cm/s  Systemic Diam: 2.00 cm MV A velocity: 35.60 cm/s MV E/A ratio:  1.96 Arvilla Meres MD Electronically signed by Arvilla Meres MD Signature Date/Time: 02/04/2023/11:52:20 AM    Final    CT Angio Chest/Abd/Pel for Dissection W and/or Wo Contrast Result Date: 02/03/2023 CLINICAL DATA:  Acute aortic syndrome (AAS) suspected severe chest pain with hypertension and concern for dissection EXAM: CT ANGIOGRAPHY CHEST, ABDOMEN AND PELVIS TECHNIQUE: Non-contrast CT of the chest was initially obtained. Multidetector CT imaging through the chest, abdomen and pelvis was performed using the standard protocol during bolus administration of intravenous contrast. Multiplanar reconstructed images and MIPs were obtained and reviewed to evaluate the vascular anatomy. RADIATION DOSE  REDUCTION: This exam was performed according to the departmental dose-optimization program which includes automated exposure control, adjustment of the mA and/or kV according to patient size and/or use of iterative reconstruction technique. CONTRAST:  OMNIPAQUE IOHEXOL 350 MG/ML SOLN COMPARISON:  CT chest 04/21/2020 FINDINGS: CTA CHEST FINDINGS Cardiovascular: Preferential opacification of the thoracic aorta. No evidence of thoracic aortic aneurysm or dissection. Normal heart size. No significant pericardial effusion. No atherosclerotic plaque of the thoracic aorta. No coronary artery calcifications. Incidentally noted replaced left vertebral artery with origin off of the aortic arch. The main pulmonary artery is normal in caliber. No central or segmental pulmonary embolus. Mediastinum/Nodes: No enlarged mediastinal, hilar, or axillary lymph nodes. Thyroid gland, trachea, and esophagus demonstrate no significant findings. Lungs/Pleura: Peribronchovascular tree-in-bud nodularity of the lingula. No pulmonary nodule. No pulmonary mass. No pleural effusion. No pneumothorax. Musculoskeletal: Stable asymmetric left greater than right gynecomastia. No suspicious lytic or blastic osseous lesions. No acute displaced fracture. 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 Associated retained 4 mm shrapnel within the superficial subcutaneus soft tissues of the posterolateral lower chest wall. Review of  the MIP images confirms the above findings. CTA ABDOMEN AND PELVIS FINDINGS VASCULAR Aorta: Normal caliber aorta without aneurysm, dissection, vasculitis or significant stenosis. Celiac: Patent without evidence of aneurysm, dissection, vasculitis or significant stenosis. SMA: Patent without evidence of aneurysm, dissection, vasculitis or significant stenosis. Renals: Both renal arteries are patent without evidence of aneurysm, dissection, vasculitis, fibromuscular dysplasia or significant stenosis. IMA: Patent  without evidence of aneurysm, dissection, vasculitis or significant stenosis. Inflow: Patent without evidence of aneurysm, dissection, vasculitis or significant stenosis. Veins: No obvious venous abnormality within the limitations of this arterial phase study. Review of the MIP images confirms the above findings. NON-VASCULAR Hepatobiliary: No focal liver abnormality. No gallstones, gallbladder wall thickening, or pericholecystic fluid. No biliary dilatation. Pancreas: No focal lesion. Normal pancreatic contour. No surrounding inflammatory changes. No main pancreatic ductal dilatation. Spleen: Normal in size without focal abnormality. Adrenals/Urinary Tract: No adrenal nodule bilaterally. Bilateral kidneys enhance symmetrically. No hydronephrosis. No hydroureter. The urinary bladder is unremarkable. Stomach/Bowel: Stomach is within normal limits. No evidence of bowel wall thickening or dilatation. Appendix appears normal. Lymphatic: No lymphadenopathy. Reproductive: Prostate is unremarkable. Other: No intraperitoneal free fluid. No intraperitoneal free gas. No organized fluid collection. Musculoskeletal: No abdominal wall hernia or abnormality. No suspicious lytic or blastic osseous lesions. No acute displaced fracture. Review of the MIP images confirms the above findings. IMPRESSION: 1. Peribronchovascular tree-in-bud nodularity of the lingula. Findings suggestive of developing atypical infection/inflammation. 2. No acute thoracic or abdominal aorta abnormality. 3. No pulmonary embolus. 4. No acute intra-abdominal or intrapelvic abnormality. 5. Stable asymmetric left greater than right gynecomastia. Recommend correlation with physical exam. 6. Retained 4 mm shrapnel within the superficial subcutaneus soft tissues of the posterolateral lower chest wall. Electronically Signed   By: Tish Frederickson M.D.   On: 02/03/2023 12:32   DG Chest 2 View Result Date: 02/03/2023 CLINICAL DATA:  Chest pain. EXAM: CHEST - 2 VIEW  COMPARISON:  10/16/2022. FINDINGS: Bilateral lung fields are clear. Bilateral costophrenic angles are clear. Normal cardio-mediastinal silhouette. No acute osseous abnormalities. The soft tissues are within normal limits. IMPRESSION: No active cardiopulmonary disease. Electronically Signed   By: Jules Schick M.D.   On: 02/03/2023 11:19    Cardiac Studies   Echocardiogram 02/04/2023: Impressions:  1. Left ventricular ejection fraction, by estimation, is 60 to 65%. The  left ventricle has normal function. The left ventricle has no regional  wall motion abnormalities. Left ventricular diastolic parameters were  normal.   2. Right ventricular systolic function is normal. The right ventricular  size is normal.   3. The mitral valve is normal in structure. Trivial mitral valve  regurgitation. No evidence of mitral stenosis.   4. The aortic valve is normal in structure. Aortic valve regurgitation is  not visualized. No aortic stenosis is present.   5. The inferior vena cava is dilated in size with <50% respiratory  variability, suggesting right atrial pressure of 15 mmHg.    Patient Profile     28 y.o. male with a history of traumatic brain injury, seizures on Vimpat, and polysubstance abuse. He has recently had an increase in seizures and was recently hospitalized for this last week. He presented on 02/03/2023 with chest pain and seizures and ruled in for NSTEMI.  Assessment & Plan    NSTEMI Patient presented to the ED with chest pain that occurred after 2 seizures and then taking his seizure medications on an empty stomach. UDS was positive for cocaine and opiates. EKG showed showed some slight  ST elevation in precordial leads consistent with early repolarization. High-sensitivity initially 30 >> 56 and troponin elevation was felt to be due to recent seizures with vasospasms from cocaine possibly playing a role as well. However, high-sensitivity increased to 15,228 overnight and is now  down-trending. CK also elevated at 1,009. Echo showed LVEF of 60-65% with hypokinesis of the posterior wall.  - Patients states he feels much better than yesterday. Currently chest pain free. - Continue IV Heparin. - Will check fasting lipid panel and hemoglobin A1c in the morning. - Will start Aspirin 81mg  daily and Crestor 20mg  daily. Will hold off on beta-blocker given cocaine use. - Will proceed with cardiac catheterization later today. Patient last ate around 8am. Will make NPO now.  The patient understands that risks include but are not limited to stroke (1 in 1000), death (1 in 1000), kidney failure [usually temporary] (1 in 500), bleeding (1 in 200), allergic reaction [possibly serious] (1 in 200), and agrees to proceed.  Tobacco Abuse Cocaine Abuse UDS was positive for cocaine and opiates. Patient reported he last used cocaine 5 days ago. - He has been counseled on the importance of complete cessation of both tobacco and cocaine. Will need to continue to emphasize this.  Otherwise, per primary team: - TBI - Seizure disorder  For questions or updates, please contact [facility] Please consult [email or web address] for contact info under        Signed, Corrin Parker, PA-C  02/04/2023, 1:10 PM    I have seen and examined the patient along with Corrin Parker, PA-C .  I have reviewed the chart, notes and new data.  I agree with PA/NP's note.  Key new complaints: Currently asymptomatic without chest pain or shortness of her palpitations or any new neurological events Key examination changes: Normal cardiovascular exam Key new findings / data: Marked increase in high-sensitivity troponin overnight.  Suggestion of posterior wall hypokinesis on echocardiogram.  PLAN: Interval marked increase in troponin (which has peaked and is now declining) and possible new wall motion abnormality do raise concern for true coronary event, despite his young age.  Keep on heparin for now  and pursue coronary angiography.  The most likely cause remains demand myocardial injury from grand mal seizure and cocaine.  Informed Consent   Shared Decision Making/Informed Consent The risks [stroke (1 in 1000), death (1 in 1000), kidney failure [usually temporary] (1 in 500), bleeding (1 in 200), allergic reaction [possibly serious] (1 in 200)], benefits (diagnostic support and management of coronary artery disease) and alternatives of a cardiac catheterization were discussed in detail with Mr. Birdsell and he is willing to proceed.      Thurmon Fair, MD, [facility] [phone] 02/04/2023, 3:38 PM

## 2023-02-04 NOTE — Progress Notes (Signed)
Patient keeps removing and refusing his telemetry monitoring. RN changed sticker leads 3 times since the start of the shift. Education provided and RN highlighted the importance of telemetry monitoring. Charge nurse, CCMD and MD made aware.
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
## 2023-02-04 NOTE — Progress Notes (Signed)
TR[facility] night cross cover note:   I was notified by RN that the patient is complaining of existing right-sided tongue discomfort that he was experiencing prior to this hospitalization.  I subsequently placed order for as needed Norco.     Newton Pigg, DO Hospitalist

## 2023-02-05 ENCOUNTER — Ambulatory Visit: Payer: Medicaid Other | Admitting: Emergency Medicine

## 2023-02-05 ENCOUNTER — Other Ambulatory Visit (HOSPITAL_COMMUNITY): Payer: Self-pay

## 2023-02-05 DIAGNOSIS — R7989 Other specified abnormal findings of blood chemistry: Secondary | ICD-10-CM | POA: Diagnosis not present

## 2023-02-05 LAB — BASIC METABOLIC PANEL
Anion gap: 12 (ref 5–15)
BUN: 9 mg/dL (ref 6–20)
CO2: 26 mmol/L (ref 22–32)
Calcium: 9.1 mg/dL (ref 8.9–10.3)
Chloride: 101 mmol/L (ref 98–111)
Creatinine, Ser: 1.2 mg/dL (ref 0.61–1.24)
GFR, Estimated: >60 mL/min (ref >60)
Glucose, Bld: 89 mg/dL (ref 70–99)
Potassium: 3.7 mmol/L (ref 3.5–5.1)
Sodium: 139 mmol/L (ref 135–145)

## 2023-02-05 LAB — CBC
HCT: 42.5 % (ref 39.0–52.0)
Hemoglobin: 14.6 g/dL (ref 13.0–17.0)
MCH: 30.3 pg (ref 26.0–34.0)
MCHC: 34.4 g/dL (ref 30.0–36.0)
MCV: 88.2 fL (ref 80.0–100.0)
Platelets: 245 10*3/uL (ref 150–400)
RBC: 4.82 MIL/uL (ref 4.22–5.81)
RDW: 13.3 % (ref 11.5–15.5)
WBC: 6.5 10*3/uL (ref 4.0–10.5)
nRBC: 0 % (ref 0.0–0.2)

## 2023-02-05 LAB — MAGNESIUM: Magnesium: 1.8 mg/dL (ref 1.7–2.4)

## 2023-02-05 MED ORDER — LACOSAMIDE 50 MG PO TABS
50.0000 mg | ORAL_TABLET | Freq: Two times a day (BID) | ORAL | 0 refills | Status: DC
  Filled 2023-02-05: qty 60, 30d supply, fill #0

## 2023-02-05 MED ORDER — ROSUVASTATIN CALCIUM 20 MG PO TABS
20.0000 mg | ORAL_TABLET | Freq: Every day | ORAL | 0 refills | Status: DC
  Filled 2023-02-05: qty 30, 30d supply, fill #0

## 2023-02-05 NOTE — Progress Notes (Signed)
Discharge instructions provided to patient. IV out. Monitor off. All medications, follow up appointments, and discharge instructions reviewed. Discharging home.

## 2023-02-05 NOTE — Discharge Summary (Signed)
Physician Discharge Summary  Thomas Carter ZOX:[id number] DOB: 1995-06-21 DOA: 02/03/2023  PCP: Donell Beers, FNP  Admit date: 02/03/2023 Discharge date: 02/05/2023 30 Day Unplanned Readmission Risk Score    Flowsheet Row ED to Hosp-Admission (Discharged) from 02/03/2023 in [facility] 3E HF PCU  30 Day Unplanned Readmission Risk Score (%) 16.96 Filed at 02/05/2023 0801       This score is the patient's risk of an unplanned readmission within 30 days of being discharged (0 -100%). The score is based on dignosis, age, lab data, medications, orders, and past utilization.   Low:  0-14.9   Medium: 15-21.9   High: 22-29.9   Extreme: 30 and above          Admitted From: Home Disposition: Home  Recommendations for Outpatient Follow-up:  Follow up with PCP in 1-2 weeks Please obtain BMP/CBC in one week Please follow up with your PCP on the following pending results: Unresulted Labs (From admission, onward)     Start     Ordered   02/05/23 0500  Basic metabolic panel  Daily,   R     Question:  Specimen collection method  Answer:  Lab=Lab collect   02/04/23 1448   02/05/23 0500  Magnesium  Daily,   R     Question:  Specimen collection method  Answer:  Lab=Lab collect   02/04/23 1448   02/05/23 0500  Lipoprotein A (LPA)  Tomorrow morning,   R        02/04/23 1724              Home Health: None Equipment/Devices: None  Discharge Condition: Stable CODE STATUS: Full Diet recommendation: Cardiac  Subjective: Seen and examined, no complaints very eager to go out of here.  Brief/Interim Summary: PMH of TBI, seizures, substance abuse cocaine positive, active smoker, obesity.  Presented to the hospital with complaints of chest pain as well as reported seizures. Chest pain was reported as central chest pain sharp pressure-like sensation without any shortness of breath. Also had an episode of seizures, witnessed by mother.  Noncompliant with Vimpat.details below.    Assessment and Plan: NSTEMI secondary to cocaine abuse causing vasospasm, troponin peaked at 15,000, seen by cardiology, echo was done which shows preserved ejection fraction, underwent cardiac cath which showed clean coronaries.  Cardiology opined that his NSTEMI was secondary to cocaine abuse and they and I personally spoke to the patient and highly recommended with long counseling about quitting cocaine.  He verbalized understanding.  Cardiology did not recommend any medications at discharge.  Recurrent seizures / Prior history of TBI.  Had witnessed seizure due to noncompliance with Vimpat Patient was seen and discharged on 1/21 for seizures. Seizures were thought secondary to history of TBI, prior history of seizures as well as ongoing use of cocaine and THC use as well as alcohol use. Discharged on Vimpat 50 mg twice daily. Reportedly was not taking Vimpat as recommended. Currently after discussion with neurology, loaded with Vimpat and is on home regimen of Vimpat again.  Patient did not have any further seizure activity.   Active smoker. Smokes half a pack a day.  I have discussed tobacco cessation with the patient.  I have counseled the patient regarding the negative impacts of continued tobacco use including but not limited to lung cancer, COPD, and cardiovascular disease.  I have discussed alternatives to tobacco and modalities that may help facilitate tobacco cessation including but not limited to biofeedback, hypnosis, and medications.  Total time spent with tobacco counseling was 5 minutes.  Obesity. Body mass index is 33.78 kg/m.  Placing the patient at high risk for poor outcome.  Diet modification and weight loss via exercise recommended.  Discharge plan was discussed with patient and/or family member and they verbalized understanding and agreed with it.  Discharge Diagnoses:  Principal Problem:   Elevated troponin Active Problems:   Chest pain   Recurrent seizures (HCC)    Leukocytosis   Cocaine use   Smoking   History of traumatic brain injury   NSTEMI (non-ST elevated myocardial infarction) [facility])    Discharge Instructions  Discharge Instructions     AMB referral to Phase II Cardiac Rehabilitation   Complete by: As directed    Normal Coronaries   Diagnosis: NSTEMI   After initial evaluation and assessments completed: Virtual Based Care may be provided alone or in conjunction with Phase 2 Cardiac Rehab based on patient barriers.: Yes   Intensive Cardiac Rehabilitation (ICR) [facility] location only OR Traditional Cardiac Rehabilitation (TCR) *If criteria for ICR are not met will enroll in TCR [facility] only): Yes      Allergies as of 02/05/2023   No Known Allergies      Medication List     TAKE these medications    lacosamide 50 MG Tabs tablet Commonly known as: VIMPAT Take 1 tablet (50 mg total) by mouth 2 (two) times daily.   rosuvastatin 20 MG tablet Commonly known as: CRESTOR Take 1 tablet (20 mg total) by mouth daily. Start taking on: February 06, 2023   UNABLE TO FIND Take 4 tablets by mouth daily as needed. Med Name: Shliajit        Follow-up Information     Donell Beers, FNP Follow up in 1 week(s).   Specialty: Nurse Practitioner Contact information: [address] [place] [address] [phone]                No Known Allergies  Consultations: Cardiology   Procedures/Studies: CARDIAC CATHETERIZATION Result Date: 02/04/2023   LV end diastolic pressure is normal.   There is no aortic valve stenosis.   Anticipated discharge date to be determined.   Provided blood pressure is controlled, anticipate discharge on 02/05/2023   No indication for antiplatelet therapy at this time . Angiographically Normal Coronary Arteries with a Left Dominant System-wraparound LAD and large Ramus Intermedius. Normal LVEDP of 13 mmHg RECOMMENDATIONS   Provided blood pressure is controlled, anticipate discharge on  02/05/2023   No indication for antiplatelet therapy at this time .   ECHOCARDIOGRAM COMPLETE Result Date: 02/04/2023    ECHOCARDIOGRAM REPORT   Patient Name:   Thomas Carter Date of Exam: 02/04/2023 Medical Rec #:  [id number]    Height:       75.0 in Accession #:    [id number]   Weight:       275.0 lb Date of Birth:  Jan 09, 1995   BSA:          2.512 m Patient Age:    27 years     BP:           121/71 mmHg Patient Gender: M            HR:           68 bpm. Exam Location:  Inpatient Procedure: 2D Echo, Cardiac Doppler and Color Doppler Indications:    Elevated Troponin  History:        Patient has no  prior history of Echocardiogram examinations.                 Risk Factors:Hypertension.  Sonographer:    Darlys Gales Referring Phys: [id number] Jonita Albee IMPRESSIONS  1. Left ventricular ejection fraction, by estimation, is 60 to 65%. The left ventricle has normal function. The left ventricle has no regional wall motion abnormalities. Left ventricular diastolic parameters were normal.  2. Right ventricular systolic function is normal. The right ventricular size is normal.  3. The mitral valve is normal in structure. Trivial mitral valve regurgitation. No evidence of mitral stenosis.  4. The aortic valve is normal in structure. Aortic valve regurgitation is not visualized. No aortic stenosis is present.  5. The inferior vena cava is dilated in size with <50% respiratory variability, suggesting right atrial pressure of 15 mmHg. FINDINGS  Left Ventricle: Left ventricular ejection fraction, by estimation, is 60 to 65%. The left ventricle has normal function. The left ventricle has no regional wall motion abnormalities. The left ventricular internal cavity size was normal in size. There is  no left ventricular hypertrophy. Left ventricular diastolic parameters were normal.  LV Wall Scoring: The posterior wall is hypokinetic. Right Ventricle: The right ventricular size is normal. No increase in right ventricular wall  thickness. Right ventricular systolic function is normal. Left Atrium: Left atrial size was normal in size. Right Atrium: Right atrial size was normal in size. Pericardium: There is no evidence of pericardial effusion. Mitral Valve: The mitral valve is normal in structure. Trivial mitral valve regurgitation. No evidence of mitral valve stenosis. Tricuspid Valve: The tricuspid valve is normal in structure. Tricuspid valve regurgitation is trivial. No evidence of tricuspid stenosis. Aortic Valve: The aortic valve is normal in structure. Aortic valve regurgitation is not visualized. No aortic stenosis is present. Aortic valve mean gradient measures 2.0 mmHg. Aortic valve peak gradient measures 3.7 mmHg. Aortic valve area, by VTI measures 2.94 cm. Pulmonic Valve: The pulmonic valve was normal in structure. Pulmonic valve regurgitation is not visualized. No evidence of pulmonic stenosis. Aorta: The aortic root is normal in size and structure. Venous: The inferior vena cava is dilated in size with less than 50% respiratory variability, suggesting right atrial pressure of 15 mmHg. IAS/Shunts: No atrial level shunt detected by color flow Doppler.  LEFT VENTRICLE PLAX 2D LVIDd:         5.10 cm   Diastology LVIDs:         3.30 cm   LV e' medial:    7.51 cm/s LV PW:         0.90 cm   LV E/e' medial:  9.3 LV IVS:        0.90 cm   LV e' lateral:   16.60 cm/s LVOT diam:     2.00 cm   LV E/e' lateral: 4.2 LV SV:         56 LV SV Index:   22 LVOT Area:     3.14 cm  RIGHT VENTRICLE RV S prime:     14.10 cm/s TAPSE (M-mode): 3.2 cm LEFT ATRIUM             Index        RIGHT ATRIUM           Index LA Vol (A2C):   40.2 ml 16.00 ml/m  RA Area:     15.00 cm LA Vol (A4C):   34.4 ml 13.69 ml/m  RA Volume:   35.20 ml  14.01 ml/m  LA Biplane Vol: 38.3 ml 15.24 ml/m  AORTIC VALVE AV Area (Vmax):    3.01 cm AV Area (Vmean):   2.91 cm AV Area (VTI):     2.94 cm AV Vmax:           96.60 cm/s AV Vmean:          74.400 cm/s AV VTI:             0.190 m AV Peak Grad:      3.7 mmHg AV Mean Grad:      2.0 mmHg LVOT Vmax:         92.60 cm/s LVOT Vmean:        68.900 cm/s LVOT VTI:          0.178 m LVOT/AV VTI ratio: 0.94  AORTA Ao Root diam: 3.20 cm MITRAL VALVE MV Area (PHT): 3.68 cm    SHUNTS MV Decel Time: 206 msec    Systemic VTI:  0.18 m MV E velocity: 69.70 cm/s  Systemic Diam: 2.00 cm MV A velocity: 35.60 cm/s MV E/A ratio:  1.96 Arvilla Meres MD Electronically signed by Arvilla Meres MD Signature Date/Time: 02/04/2023/11:52:20 AM    Final    CT Angio Chest/Abd/Pel for Dissection W and/or Wo Contrast Result Date: 02/03/2023 CLINICAL DATA:  Acute aortic syndrome (AAS) suspected severe chest pain with hypertension and concern for dissection EXAM: CT ANGIOGRAPHY CHEST, ABDOMEN AND PELVIS TECHNIQUE: Non-contrast CT of the chest was initially obtained. Multidetector CT imaging through the chest, abdomen and pelvis was performed using the standard protocol during bolus administration of intravenous contrast. Multiplanar reconstructed images and MIPs were obtained and reviewed to evaluate the vascular anatomy. RADIATION DOSE REDUCTION: This exam was performed according to the departmental dose-optimization program which includes automated exposure control, adjustment of the mA and/or kV according to patient size and/or use of iterative reconstruction technique. CONTRAST:  OMNIPAQUE IOHEXOL 350 MG/ML SOLN COMPARISON:  CT chest 04/21/2020 FINDINGS: CTA CHEST FINDINGS Cardiovascular: Preferential opacification of the thoracic aorta. No evidence of thoracic aortic aneurysm or dissection. Normal heart size. No significant pericardial effusion. No atherosclerotic plaque of the thoracic aorta. No coronary artery calcifications. Incidentally noted replaced left vertebral artery with origin off of the aortic arch. The main pulmonary artery is normal in caliber. No central or segmental pulmonary embolus. Mediastinum/Nodes: No enlarged mediastinal,  hilar, or axillary lymph nodes. Thyroid gland, trachea, and esophagus demonstrate no significant findings. Lungs/Pleura: Peribronchovascular tree-in-bud nodularity of the lingula. No pulmonary nodule. No pulmonary mass. No pleural effusion. No pneumothorax. Musculoskeletal: Stable asymmetric left greater than right gynecomastia. No suspicious lytic or blastic osseous lesions. No acute displaced fracture. 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 Associated retained 4 mm shrapnel within the superficial subcutaneus soft tissues of the posterolateral lower chest wall. Review of the MIP images confirms the above findings. CTA ABDOMEN AND PELVIS FINDINGS VASCULAR Aorta: Normal caliber aorta without aneurysm, dissection, vasculitis or significant stenosis. Celiac: Patent without evidence of aneurysm, dissection, vasculitis or significant stenosis. SMA: Patent without evidence of aneurysm, dissection, vasculitis or significant stenosis. Renals: Both renal arteries are patent without evidence of aneurysm, dissection, vasculitis, fibromuscular dysplasia or significant stenosis. IMA: Patent without evidence of aneurysm, dissection, vasculitis or significant stenosis. Inflow: Patent without evidence of aneurysm, dissection, vasculitis or significant stenosis. Veins: No obvious venous abnormality within the limitations of this arterial phase study. Review of the MIP images confirms the above findings. NON-VASCULAR Hepatobiliary: No focal liver abnormality. No gallstones, gallbladder wall thickening, or  pericholecystic fluid. No biliary dilatation. Pancreas: No focal lesion. Normal pancreatic contour. No surrounding inflammatory changes. No main pancreatic ductal dilatation. Spleen: Normal in size without focal abnormality. Adrenals/Urinary Tract: No adrenal nodule bilaterally. Bilateral kidneys enhance symmetrically. No hydronephrosis. No hydroureter. The urinary bladder is unremarkable. Stomach/Bowel: Stomach is  within normal limits. No evidence of bowel wall thickening or dilatation. Appendix appears normal. Lymphatic: No lymphadenopathy. Reproductive: Prostate is unremarkable. Other: No intraperitoneal free fluid. No intraperitoneal free gas. No organized fluid collection. Musculoskeletal: No abdominal wall hernia or abnormality. No suspicious lytic or blastic osseous lesions. No acute displaced fracture. Review of the MIP images confirms the above findings. IMPRESSION: 1. Peribronchovascular tree-in-bud nodularity of the lingula. Findings suggestive of developing atypical infection/inflammation. 2. No acute thoracic or abdominal aorta abnormality. 3. No pulmonary embolus. 4. No acute intra-abdominal or intrapelvic abnormality. 5. Stable asymmetric left greater than right gynecomastia. Recommend correlation with physical exam. 6. Retained 4 mm shrapnel within the superficial subcutaneus soft tissues of the posterolateral lower chest wall. Electronically Signed   By: Tish Frederickson M.D.   On: 02/03/2023 12:32   DG Chest 2 View Result Date: 02/03/2023 CLINICAL DATA:  Chest pain. EXAM: CHEST - 2 VIEW COMPARISON:  10/16/2022. FINDINGS: Bilateral lung fields are clear. Bilateral costophrenic angles are clear. Normal cardio-mediastinal silhouette. No acute osseous abnormalities. The soft tissues are within normal limits. IMPRESSION: No active cardiopulmonary disease. Electronically Signed   By: Jules Schick M.D.   On: 02/03/2023 11:19   EEG adult Result Date: 01/28/2023 Charlsie Quest, MD     01/28/2023 11:11 AM Patient Name: Thomas Carter MRN: [id number] Epilepsy Attending: Charlsie Quest Referring Physician/Provider: Achille Rich, PA-C Date: 01/28/2023 Duration: 22.07 mins Patient history: 28 y.o. male with h/o TBI with encephalomalacia, polysubstance use presents emerged part today for evaluation of seizure. EEG to evaluate for seizure Level of alertness: Awake, asleep AEDs during EEG study: None Technical  aspects: This EEG study was done with scalp electrodes positioned according to the 10-20 International system of electrode placement. Electrical activity was reviewed with band pass filter of 1-70Hz , sensitivity of 7 uV/mm, display speed of 74mm/sec with a 60Hz  notched filter applied as appropriate. EEG data were recorded continuously and digitally stored.  Video monitoring was available and reviewed as appropriate. Description: The posterior dominant rhythm consists of 9 Hz activity of moderate voltage (25-35 uV) seen predominantly in posterior head regions, symmetric and reactive to eye opening and eye closing. Sleep was characterized by vertex waves, sleep spindles (12 to 14 Hz), maximal frontocentral region. Hyperventilation and photic stimulation were not performed.   IMPRESSION: This study is within normal limits. No seizures or epileptiform discharges were seen throughout the recording. A normal interictal EEG does not exclude the diagnosis of epilepsy. Charlsie Quest   CT Head Wo Contrast Result Date: 01/28/2023 CLINICAL DATA:  28 year old male with acute seizure. Reportedly prior history but not on seizure medication at this time. EXAM: CT HEAD WITHOUT CONTRAST TECHNIQUE: Contiguous axial images were obtained from the base of the skull through the vertex without intravenous contrast. RADIATION DOSE REDUCTION: This exam was performed according to the departmental dose-optimization program which includes automated exposure control, adjustment of the mA and/or kV according to patient size and/or use of iterative reconstruction technique. COMPARISON:  Head CT 10/17/2022 and earlier. FINDINGS: Brain: Normal background cerebral volume. No midline shift, ventriculomegaly, mass effect, evidence of mass lesion, intracranial hemorrhage or evidence of cortically based acute infarction. Encephalomalacia right temporal tip,  temporal lobe (series 4, image 11) which probably is posttraumatic, traumatic intracranial  hemorrhage there in 2011. Elsewhere gray-white differentiation appears stable and within normal limits. Vascular: Stable.  No suspicious intracranial vascular hyperdensity. Skull: Subtle chronic left lamina papyracea fracture, and possible chronic nasal bone fractures are stable. No acute osseous abnormality identified. Sinuses/Orbits: Scattered mild paranasal sinus mucosal thickening and bubbly opacity but overall well aerated. Tympanic cavities and mastoids remain clear. Other: Similar Disconjugate gaze. No acute orbit or scalp soft tissue injury identified. IMPRESSION: 1. No acute intracranial abnormality. 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 2. Minimal new paranasal sinus inflammation. Electronically Signed   By: Odessa Fleming M.D.   On: 01/28/2023 05:36     Discharge Exam: Vitals:   02/05/23 0441 02/05/23 0750  BP: (!) 125/59 130/69  Pulse: 66 70  Resp: 20 20  Temp: 97.6 F (36.4 C) 98.2 F (36.8 C)  SpO2: 93% 97%   Vitals:   02/04/23 2015 02/05/23 0111 02/05/23 0441 02/05/23 0750  BP: 138/80 125/65 (!) 125/59 130/69  Pulse: 71 62 66 70  Resp: (!) 21 20 20 20   Temp: 97.6 F (36.4 C) 97.6 F (36.4 C) 97.6 F (36.4 C) 98.2 F (36.8 C)  TempSrc: Oral Oral Oral Oral  SpO2: 97% 92% 93% 97%  Weight:      Height:        General: Pt is alert, awake, not in acute distress Cardiovascular: RRR, S1/S2 +, no rubs, no gallops Respiratory: CTA bilaterally, no wheezing, no rhonchi Abdominal: Soft, NT, ND, bowel sounds + Extremities: no edema, no cyanosis    The results of significant diagnostics from this hospitalization (including imaging, microbiology, ancillary and laboratory) are listed below for reference.     Microbiology: No results found for this or any previous visit (from the past 240 hours).   Labs: BNP (last 3 results) No results for input(s): "BNP" in the last 8760 hours. Basic Metabolic Panel: Recent Labs  Lab 02/03/23 1047  02/04/23 0629 02/05/23 0830  NA 136 136 139  K 3.8 3.7 3.7  CL 103 102 101  CO2 20* 25 26  GLUCOSE 99 103* 89  BUN 11 10 9   CREATININE 1.40* 1.42* 1.20  CALCIUM 9.3 8.6* 9.1  MG  --   --  1.8   Liver Function Tests: No results for input(s): "AST", "ALT", "ALKPHOS", "BILITOT", "PROT", "ALBUMIN" in the last 168 hours. No results for input(s): "LIPASE", "AMYLASE" in the last 168 hours. No results for input(s): "AMMONIA" in the last 168 hours. CBC: Recent Labs  Lab 02/03/23 1047 02/04/23 0629 02/05/23 0830  WBC 11.7* 8.0 6.5  HGB 15.4 14.3 14.6  HCT 44.7 41.1 42.5  MCV 86.3 87.8 88.2  PLT 277 245 245   Cardiac Enzymes: Recent Labs  Lab 02/03/23 1651  CKTOTAL 1,009*   BNP: Invalid input(s): "POCBNP" CBG: No results for input(s): "GLUCAP" in the last 168 hours. D-Dimer No results for input(s): "DDIMER" in the last 72 hours. Hgb A1c No results for input(s): "HGBA1C" in the last 72 hours. Lipid Profile Recent Labs    02/03/23 1824  CHOL 165  HDL 55  LDLCALC 103*  TRIG 34  CHOLHDL 3.0   Thyroid function studies No results for input(s): "TSH", "T4TOTAL", "T3FREE", "THYROIDAB" in the last 72 hours.  Invalid input(s): "FREET3" Anemia work up No results for input(s): "VITAMINB12", "FOLATE", "FERRITIN", "TIBC", "IRON", "RETICCTPCT" in the last 72 hours. Urinalysis    Component Value Date/Time   COLORURINE YELLOW  10/17/2022 0249   APPEARANCEUR CLEAR 10/17/2022 0249   LABSPEC 1.017 10/17/2022 0249   PHURINE 5.0 10/17/2022 0249   GLUCOSEU NEGATIVE 10/17/2022 0249   HGBUR NEGATIVE 10/17/2022 0249   BILIRUBINUR NEGATIVE 10/17/2022 0249   BILIRUBINUR negative 04/23/2022 0900   KETONESUR NEGATIVE 10/17/2022 0249   PROTEINUR 100 (A) 10/17/2022 0249   UROBILINOGEN 2.0 (A) 04/23/2022 0900   UROBILINOGEN 1.0 02/02/2022 1251   NITRITE NEGATIVE 10/17/2022 0249   LEUKOCYTESUR NEGATIVE 10/17/2022 0249   Sepsis Labs Recent Labs  Lab 02/03/23 1047 02/04/23 0629  02/05/23 0830  WBC 11.7* 8.0 6.5   Microbiology No results found for this or any previous visit (from the past 240 hours).  FURTHER DISCHARGE INSTRUCTIONS:   Get Medicines reviewed and adjusted: Please take all your medications with you for your next visit with your Primary MD   Laboratory/radiological data: Please request your Primary MD to go over all hospital tests and procedure/radiological results at the follow up, please ask your Primary MD to get all Hospital records sent to his/her office.   In some cases, they will be blood work, cultures and biopsy results pending at the time of your discharge. Please request that your primary care M.D. goes through all the records of your hospital data and follows up on these results.   Also Note the following: If you experience worsening of your admission symptoms, develop shortness of breath, life threatening emergency, suicidal or homicidal thoughts you must seek medical attention immediately by calling 911 or calling your MD immediately  if symptoms less severe.   You must read complete instructions/literature along with all the possible adverse reactions/side effects for all the Medicines you take and that have been prescribed to you. Take any new Medicines after you have completely understood and accpet all the possible adverse reactions/side effects.    Do not drive when taking Pain medications or sleeping medications (Benzodaizepines)   Do not take more than prescribed Pain, Sleep and Anxiety Medications. It is not advisable to combine anxiety,sleep and pain medications without talking with your primary care practitioner   Special Instructions: If you have smoked or chewed Tobacco  in the last 2 yrs please stop smoking, stop any regular Alcohol  and or any Recreational drug use.   Wear Seat belts while driving.   Please note: You were cared for by a hospitalist during your hospital stay. Once you are discharged, your primary care  physician will handle any further medical issues. Please note that NO REFILLS for any discharge medications will be authorized once you are discharged, as it is imperative that you return to your primary care physician (or establish a relationship with a primary care physician if you do not have one) for your post hospital discharge needs so that they can reassess your need for medications and monitor your lab values  Time coordinating discharge: Over 30 minutes  SIGNED:   Hughie Closs, MD  [facility] 02/05/2023, 12:14 PM *Please note that this is a verbal dictation therefore any spelling or grammatical errors are due to the "[employer] One" system interpretation. If 7PM-7AM, please contact night-coverage [email or web address]

## 2023-02-05 NOTE — Plan of Care (Signed)
Problem: Education: Goal: Knowledge of General Education information will improve Description: Including pain rating scale, medication(s)/side effects and non-pharmacologic comfort measures Outcome: Progressing   Problem: Clinical Measurements: Goal: Will remain free from infection Outcome: Progressing   Problem: Clinical Measurements: Goal: Diagnostic test results will improve Outcome: Progressing

## 2023-02-05 NOTE — Progress Notes (Signed)
   Patient Name: Thomas Carter Date of Encounter: 02/05/2023 [facility] Cardiologist: None   Interval Summary  .    No CV complaints. Clean coronaries and normal filling pressures at cath. Nortmal LV function on echo.  Vital Signs .    Vitals:   02/04/23 2015 02/05/23 0111 02/05/23 0441 02/05/23 0750  BP: 138/80 125/65 (!) 125/59 130/69  Pulse: 71 62 66 70  Resp: (!) 21 20 20 20   Temp: 97.6 F (36.4 C) 97.6 F (36.4 C) 97.6 F (36.4 C) 98.2 F (36.8 C)  TempSrc: Oral Oral Oral Oral  SpO2: 97% 92% 93% 97%  Weight:      Height:        Intake/Output Summary (Last 24 hours) at 02/05/2023 0927 Last data filed at 02/05/2023 0749 Gross per 24 hour  Intake 657 ml  Output 280 ml  Net 377 ml      02/05/2023    4:46 AM 02/04/2023    1:35 PM 02/03/2023   10:37 AM  Last 3 Weights  Weight (lbs) -- 270 lb 4.5 oz 275 lb  Weight (kg) -- 122.6 kg 124.739 kg      Telemetry/ECG    NSR - Personally Reviewed  Physical Exam .   GEN: No acute distress.   Neck: No JVD Cardiac: RRR, no murmurs, rubs, or gallops.  Respiratory: Clear to auscultation bilaterally. GI: Soft, nontender, non-distended  MS: No edema Healthy R radial cath access site. Assessment & Plan .     Cocaine related myocardial infarction, without serious arrhythmia, reduction in LV function or coronary occlusions. No specific cardiac therapy recommended.  Strongly recommend avoiding cocaine and other stimulants. Also recommend smoking cessation. Routine Cardiology f/u not indicated.  For questions or updates, please contact [facility] Please consult [email or web address] for contact info under        Signed, Thurmon Fair, MD

## 2023-02-06 ENCOUNTER — Telehealth: Payer: Self-pay

## 2023-02-06 LAB — LIPOPROTEIN A (LPA): Lipoprotein (a): 105.5 nmol/L — ABNORMAL HIGH (ref <75.0)

## 2023-02-06 NOTE — Transitions of Care (Post Inpatient/ED Visit) (Signed)
   02/06/2023  Name: Thomas Carter MRN: [id number] DOB: 23-Oct-1995  Today's TOC FU Call Status: Today's TOC FU Call Status:: Successful TOC FU Call Completed TOC FU Call Complete Date: 02/06/23 Patient's Name and Date of Birth confirmed.  Transition Care Management Follow-up Telephone Call Discharge Facility: [facility]) Type of Discharge: Inpatient Admission Primary Inpatient Discharge Diagnosis:: NSTEMi How have you been since you were released from the hospital?: Better Any questions or concerns?: No  Items Reviewed: Did you receive and understand the discharge instructions provided?: Yes Medications obtained,verified, and reconciled?: Yes (Medications Reviewed) Any new allergies since your discharge?: No Dietary orders reviewed?: Yes Do you have support at home?: Yes People in Home: parent(s)  Medications Reviewed Today: Medications Reviewed Today     Reviewed by Karena Addison, LPN (Licensed Practical Nurse) on 02/06/23 at [id number]  Med List Status: <None>   Medication Order Taking? Sig Documenting Provider Last Dose Status Informant  lacosamide (VIMPAT) 50 MG TABS tablet [id number]  Take 1 tablet (50 mg total) by mouth 2 (two) times daily. Hughie Closs, MD  Active   rosuvastatin (CRESTOR) 20 MG tablet [id number]  Take 1 tablet (20 mg total) by mouth daily. Hughie Closs, MD  Active   UNABLE TO FIND [id number] No Take 4 tablets by mouth daily as needed. Med Name: Shliajit [provider] Past Week Active Self, Pharmacy Records            Home Care and Equipment/Supplies: Were Home Health Services Ordered?: NA Any new equipment or medical supplies ordered?: NA  Functional Questionnaire: Do you need assistance with bathing/showering or dressing?: No Do you need assistance with meal preparation?: No Do you need assistance with eating?: No Do you have difficulty maintaining continence: No Do you need assistance with getting out of bed/getting out of a  chair/moving?: No Do you have difficulty managing or taking your medications?: No  Follow up appointments reviewed: PCP Follow-up appointment confirmed?: Yes Date of PCP follow-up appointment?: 02/12/23 Follow-up Provider: [facility] Follow-up appointment confirmed?: No Reason Specialist Follow-Up Not Confirmed: Patient has Specialist Provider Number and will Call for Appointment Do you need transportation to your follow-up appointment?: No Do you understand care options if your condition(s) worsen?: Yes-patient verbalized understanding    SIGNATURE Karena Addison, LPN [facility] Nurse Health Advisor Direct Dial [phone]

## 2023-02-12 ENCOUNTER — Other Ambulatory Visit (HOSPITAL_COMMUNITY): Payer: Self-pay

## 2023-02-12 ENCOUNTER — Ambulatory Visit: Payer: Medicaid Other | Admitting: Nurse Practitioner

## 2023-02-12 ENCOUNTER — Encounter: Payer: Self-pay | Admitting: Nurse Practitioner

## 2023-02-12 VITALS — BP 130/67 | HR 83 | Temp 97.0°F | Wt 281.0 lb

## 2023-02-12 DIAGNOSIS — F17210 Nicotine dependence, cigarettes, uncomplicated: Secondary | ICD-10-CM | POA: Diagnosis not present

## 2023-02-12 DIAGNOSIS — G40909 Epilepsy, unspecified, not intractable, without status epilepticus: Secondary | ICD-10-CM

## 2023-02-12 DIAGNOSIS — Z716 Tobacco abuse counseling: Secondary | ICD-10-CM

## 2023-02-12 DIAGNOSIS — F1290 Cannabis use, unspecified, uncomplicated: Secondary | ICD-10-CM

## 2023-02-12 DIAGNOSIS — F1090 Alcohol use, unspecified, uncomplicated: Secondary | ICD-10-CM

## 2023-02-12 DIAGNOSIS — E785 Hyperlipidemia, unspecified: Secondary | ICD-10-CM

## 2023-02-12 DIAGNOSIS — R569 Unspecified convulsions: Secondary | ICD-10-CM | POA: Diagnosis not present

## 2023-02-12 DIAGNOSIS — I214 Non-ST elevation (NSTEMI) myocardial infarction: Secondary | ICD-10-CM

## 2023-02-12 DIAGNOSIS — E669 Obesity, unspecified: Secondary | ICD-10-CM | POA: Insufficient documentation

## 2023-02-12 DIAGNOSIS — F1410 Cocaine abuse, uncomplicated: Secondary | ICD-10-CM

## 2023-02-12 DIAGNOSIS — Z09 Encounter for follow-up examination after completed treatment for conditions other than malignant neoplasm: Secondary | ICD-10-CM

## 2023-02-12 MED ORDER — LACOSAMIDE 50 MG PO TABS
50.0000 mg | ORAL_TABLET | Freq: Two times a day (BID) | ORAL | 0 refills | Status: DC
  Filled 2023-02-12: qty 60, 30d supply, fill #0

## 2023-02-12 MED ORDER — ROSUVASTATIN CALCIUM 20 MG PO TABS
20.0000 mg | ORAL_TABLET | Freq: Every day | ORAL | 1 refills | Status: DC
  Filled 2023-02-12: qty 90, 90d supply, fill #0

## 2023-02-12 NOTE — Assessment & Plan Note (Signed)
 Wt Readings from Last 3 Encounters:  02/12/23 281 lb (127.5 kg)  02/04/23 270 lb 4.5 oz (122.6 kg)  01/28/23 275 lb (124.7 kg)   Body mass index is 35.12 kg/m.  Patient counseled on low-carb diet Encouraged engage in regular moderate to vigorous exercise at least 150 minutes weekly as tolerated

## 2023-02-12 NOTE — Assessment & Plan Note (Signed)
Cessation encouraged Patient declined referral for addiction counseling

## 2023-02-12 NOTE — Assessment & Plan Note (Addendum)
 Continue Vimpat  50 mg twice daily Encouraged to keep upcoming appointment with neurology Encouraged to avoid operating heavy machinery, climbing ladders or driving until seizure-free for at least 6 months
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
## 2023-02-12 NOTE — Assessment & Plan Note (Signed)
 Need to limit alcohol to no more than 2 drinks a day discussed.  I discussed with the patient that even better if he avoids alcohol altogether.  He declined referral for addiction counseling

## 2023-02-12 NOTE — Patient Instructions (Signed)
.   NSTEMI (non-ST elevated myocardial infarction) (HCC) (Primary)  - Basic Metabolic Panel - CBC - rosuvastatin  (CRESTOR ) 20 MG tablet; Take 1 tablet (20 mg total) by mouth daily.  Dispense: 90 tablet; Refill: 1    . Seizures (HCC)  - lacosamide  (VIMPAT ) 50 MG TABS tablet; Take 1 tablet (50 mg total) by mouth 2 (two) times daily.  Dispense: 60 tablet; Refill: 0    Dyslipidemia  - rosuvastatin  (CRESTOR ) 20 MG tablet; Take 1 tablet (20 mg total) by mouth daily.  Dispense: 90 tablet; Refill: 1   It is important that you exercise regularly at least 30 minutes 5 times a week as tolerated  Think about what you will eat, plan ahead. Choose  clean, green, fresh or frozen over canned, processed or packaged foods which are more sugary, salty and fatty. 70 to 75% of food eaten should be vegetables and fruit. Three meals at set times with snacks allowed between meals, but they must be fruit or vegetables. Aim to eat over a 12 hour period , example 7 am to 7 pm, and STOP after  your last meal of the day. Drink water ,generally about 64 ounces per day, no other drink is as healthy. Fruit juice is best enjoyed in a healthy way, by EATING the fruit.  Thanks for choosing [facility] we consider it a privelige to serve you.

## 2023-02-12 NOTE — Assessment & Plan Note (Signed)
 Cessation encouraged Patient declined referral for addiction counseling

## 2023-02-12 NOTE — Assessment & Plan Note (Signed)
 Hospital chart reviewed, including discharge summary Medications reconciled and reviewed with the patient in detail

## 2023-02-12 NOTE — Assessment & Plan Note (Addendum)
 Smokes about 0.5pack/day  Asked about quitting: confirms that he/she currently smokes cigarettes Advise to quit smoking: Educated about QUITTING to reduce the risk of cancer, cardio and cerebrovascular disease. Assess willingness: Unwilling to quit at this time, not working on cutting back. Assist with counseling and pharmacotherapy: Counseled for 5 minutes and literature provided. Arrange for follow up: follow up in 2 months and continue to offer help.

## 2023-02-13 LAB — CBC
Hematocrit: 40.7 % (ref 37.5–51.0)
Hemoglobin: 14 g/dL (ref 13.0–17.7)
MCH: 30.4 pg (ref 26.6–33.0)
MCHC: 34.4 g/dL (ref 31.5–35.7)
MCV: 89 fL (ref 79–97)
Platelets: 289 10*3/uL (ref 150–450)
RBC: 4.6 x10E6/uL (ref 4.14–5.80)
RDW: 12.7 % (ref 11.6–15.4)
WBC: 6.9 10*3/uL (ref 3.4–10.8)

## 2023-02-13 LAB — BASIC METABOLIC PANEL
BUN/Creatinine Ratio: 10 (ref 9–20)
BUN: 16 mg/dL (ref 6–20)
CO2: 23 mmol/L (ref 20–29)
Calcium: 9 mg/dL (ref 8.7–10.2)
Chloride: 105 mmol/L (ref 96–106)
Creatinine, Ser: 1.6 mg/dL — ABNORMAL HIGH (ref 0.76–1.27)
Glucose: 96 mg/dL (ref 70–99)
Potassium: 4.1 mmol/L (ref 3.5–5.2)
Sodium: 143 mmol/L (ref 134–144)
eGFR: 59 mL/min/{1.73_m2} — ABNORMAL LOW (ref >59)

## 2023-03-05 ENCOUNTER — Other Ambulatory Visit: Payer: Self-pay

## 2023-03-05 ENCOUNTER — Emergency Department (HOSPITAL_COMMUNITY)
Admission: EM | Admit: 2023-03-05 | Discharge: 2023-03-06 | Disposition: A | Discharge status: Home / Self Care | Payer: Medicaid Other | Attending: Emergency Medicine | Admitting: Emergency Medicine

## 2023-03-05 DIAGNOSIS — G9389 Other specified disorders of brain: Secondary | ICD-10-CM | POA: Diagnosis not present

## 2023-03-05 DIAGNOSIS — R519 Headache, unspecified: Secondary | ICD-10-CM | POA: Diagnosis not present

## 2023-03-05 DIAGNOSIS — R569 Unspecified convulsions: Secondary | ICD-10-CM | POA: Insufficient documentation

## 2023-03-05 DIAGNOSIS — S00532A Contusion of oral cavity, initial encounter: Secondary | ICD-10-CM | POA: Insufficient documentation

## 2023-03-05 DIAGNOSIS — W228XXA Striking against or struck by other objects, initial encounter: Secondary | ICD-10-CM | POA: Diagnosis not present

## 2023-03-05 DIAGNOSIS — S0993XA Unspecified injury of face, initial encounter: Secondary | ICD-10-CM | POA: Diagnosis present

## 2023-03-05 DIAGNOSIS — Y9301 Activity, walking, marching and hiking: Secondary | ICD-10-CM | POA: Insufficient documentation

## 2023-03-05 DIAGNOSIS — R000 Tachycardia, unspecified: Secondary | ICD-10-CM | POA: Diagnosis not present

## 2023-03-05 MED ORDER — ACETAMINOPHEN 500 MG PO TABS
1000.0000 mg | ORAL_TABLET | Freq: Once | ORAL | Status: AC
  Administered 2023-03-06: 1000 mg via ORAL
  Filled 2023-03-05: qty 2

## 2023-03-05 MED ORDER — LACOSAMIDE 50 MG PO TABS
50.0000 mg | ORAL_TABLET | Freq: Once | ORAL | Status: AC
  Administered 2023-03-06: 50 mg via ORAL
  Filled 2023-03-05: qty 1

## 2023-03-05 NOTE — ED Provider Notes (Signed)
 [place] EMERGENCY DEPARTMENT AT [facility] Provider Note   CSN: [id number] Arrival date & time: 03/05/23  2322     History {Add pertinent medical, surgical, social history, OB history to HPI:1} Chief Complaint  Patient presents with   Seizures    Thomas Carter is a 28 y.o. male.  The history is provided by the patient and medical records.  Seizures    Home Medications Prior to Admission medications   Medication Sig Start Date End Date Taking? Authorizing Provider  lacosamide (VIMPAT) 50 MG TABS tablet Take 1 tablet (50 mg total) by mouth 2 (two) times daily. 02/27/23 03/29/23  Donell Beers, FNP  rosuvastatin (CRESTOR) 20 MG tablet Take 1 tablet (20 mg total) by mouth daily. 02/12/23   Paseda, Baird Kay, FNP  UNABLE TO FIND Take 4 tablets by mouth daily as needed. Med Name: Shliajit Patient not taking: Reported on 02/12/2023    [provider]      Allergies    Patient has no known allergies.    Review of Systems   Review of Systems  Neurological:  Positive for seizures.  All other systems reviewed and are negative.   Physical Exam Updated Vital Signs Ht 6\' 3"  (1.905 m)   Wt 124.7 kg   BMI 34.37 kg/m   Physical Exam Vitals and nursing note reviewed.  Constitutional:      Appearance: He is well-developed.  HENT:     Head: Normocephalic and atraumatic.     Mouth/Throat:     Comments: Dried blood present around the mouth, contusion noted to distal tongue, no open wound or laceration, dentition intact Eyes:     Conjunctiva/sclera: Conjunctivae normal.     Pupils: Pupils are equal, round, and reactive to light.  Cardiovascular:     Rate and Rhythm: Normal rate and regular rhythm.     Heart sounds: Normal heart sounds.  Pulmonary:     Effort: Pulmonary effort is normal.     Breath sounds: Normal breath sounds.  Abdominal:     General: Bowel sounds are normal.     Palpations: Abdomen is soft.  Musculoskeletal:        General: Normal  range of motion.     Cervical back: Normal range of motion.  Skin:    General: Skin is warm and dry.  Neurological:     Mental Status: He is alert and oriented to person, place, and time.     Comments: Seems a bit drowsy still but arouses appropriately on exam and is oriented x 3, moving all extremities well, no focal deficit     ED Results / Procedures / Treatments   Labs (all labs ordered are listed, but only abnormal results are displayed) Labs Reviewed  CBC WITH DIFFERENTIAL/PLATELET  ETHANOL  RAPID URINE DRUG SCREEN, HOSP PERFORMED  COMPREHENSIVE METABOLIC PANEL    EKG EKG Interpretation Date/Time:  Wednesday March 05 2023 23:34:33 EST Ventricular Rate:  109 PR Interval:  153 QRS Duration:  89 QT Interval:  339 QTC Calculation: 457 R Axis:   97  Text Interpretation: Sinus tachycardia Borderline right axis deviation Abnormal T, consider ischemia, inferior leads Confirmed by Kennis Carina [phone]) on 03/05/2023 11:38:23 PM  Radiology No results found.  Procedures Procedures  {Document cardiac monitor, telemetry assessment procedure when appropriate:1}  Medications Ordered in ED Medications  acetaminophen (TYLENOL) tablet 1,000 mg (has no administration in time range)  lacosamide (VIMPAT) tablet 50 mg (has no administration in time range)  ED Course/ Medical Decision Making/ A&P   {   Click here for ABCD2, HEART and other calculatorsREFRESH Note before signing :1}                              Medical Decision Making Amount and/or Complexity of Data Reviewed Labs: ordered. Radiology: ordered.  Risk OTC drugs. Prescription drug management.   ***  {Document critical care time when appropriate:1} {Document review of labs and clinical decision tools ie heart score, Chads2Vasc2 etc:1}  {Document your independent review of radiology images, and any outside records:1} {Document your discussion with family members, caretakers, and with  consultants:1} {Document social determinants of health affecting pt's care:1} {Document your decision making why or why not admission, treatments were needed:1} Final Clinical Impression(s) / ED Diagnoses Final diagnoses:  None    Rx / DC Orders ED Discharge Orders     None

## 2023-03-05 NOTE — ED Triage Notes (Addendum)
 Pt BIB EMS from home. Reports pt had seizure, witnessed by mom unknown how long. Post ictal initially with EMS, A&Ox4 on arrival. Recent sx diagnosis, pt is unsure of if he took sx meds past 2 days  Pt c/o head pain and also bit tongue with dried blood on cheek, denies thinners.   EMS VS 160/80, HR 110, 99% RA, cbg 104

## 2023-03-06 ENCOUNTER — Emergency Department (HOSPITAL_COMMUNITY): Payer: Medicaid Other

## 2023-03-06 ENCOUNTER — Other Ambulatory Visit (HOSPITAL_COMMUNITY): Payer: Medicaid Other

## 2023-03-06 DIAGNOSIS — G9389 Other specified disorders of brain: Secondary | ICD-10-CM | POA: Diagnosis not present

## 2023-03-06 DIAGNOSIS — R569 Unspecified convulsions: Secondary | ICD-10-CM | POA: Diagnosis not present

## 2023-03-06 LAB — COMPREHENSIVE METABOLIC PANEL
ALT: 25 U/L (ref 0–44)
AST: 24 U/L (ref 15–41)
Albumin: 3.7 g/dL (ref 3.5–5.0)
Alkaline Phosphatase: 62 U/L (ref 38–126)
Anion gap: 17 — ABNORMAL HIGH (ref 5–15)
BUN: 15 mg/dL (ref 6–20)
CO2: 21 mmol/L — ABNORMAL LOW (ref 22–32)
Calcium: 9.3 mg/dL (ref 8.9–10.3)
Chloride: 102 mmol/L (ref 98–111)
Creatinine, Ser: 1.39 mg/dL — ABNORMAL HIGH (ref 0.61–1.24)
GFR, Estimated: >60 mL/min (ref >60)
Glucose, Bld: 118 mg/dL — ABNORMAL HIGH (ref 70–99)
Potassium: 3.9 mmol/L (ref 3.5–5.1)
Sodium: 140 mmol/L (ref 135–145)
Total Bilirubin: 0.3 mg/dL (ref 0.0–1.2)
Total Protein: 6.5 g/dL (ref 6.5–8.1)

## 2023-03-06 LAB — CBC WITH DIFFERENTIAL/PLATELET
Abs Immature Granulocytes: 0.02 10*3/uL (ref 0.00–0.07)
Basophils Absolute: 0 10*3/uL (ref 0.0–0.1)
Basophils Relative: 0 %
Eosinophils Absolute: 0.1 10*3/uL (ref 0.0–0.5)
Eosinophils Relative: 1 %
HCT: 41.4 % (ref 39.0–52.0)
Hemoglobin: 14.3 g/dL (ref 13.0–17.0)
Immature Granulocytes: 0 %
Lymphocytes Relative: 20 %
Lymphs Abs: 1.6 10*3/uL (ref 0.7–4.0)
MCH: 30.3 pg (ref 26.0–34.0)
MCHC: 34.5 g/dL (ref 30.0–36.0)
MCV: 87.7 fL (ref 80.0–100.0)
Monocytes Absolute: 0.7 10*3/uL (ref 0.1–1.0)
Monocytes Relative: 9 %
Neutro Abs: 5.4 10*3/uL (ref 1.7–7.7)
Neutrophils Relative %: 70 %
Platelets: 233 10*3/uL (ref 150–400)
RBC: 4.72 MIL/uL (ref 4.22–5.81)
RDW: 12.8 % (ref 11.5–15.5)
WBC: 7.8 10*3/uL (ref 4.0–10.5)
nRBC: 0 % (ref 0.0–0.2)

## 2023-03-06 LAB — RAPID URINE DRUG SCREEN, HOSP PERFORMED
Amphetamines: NOT DETECTED
Barbiturates: NOT DETECTED
Benzodiazepines: NOT DETECTED
Cocaine: POSITIVE — AB
Opiates: NOT DETECTED
Tetrahydrocannabinol: POSITIVE — AB

## 2023-03-06 LAB — ETHANOL: Alcohol, Ethyl (B): <10 mg/dL (ref <10)

## 2023-03-06 MED ORDER — LACOSAMIDE 50 MG PO TABS: 50.0000 mg | ORAL_TABLET | Freq: Two times a day (BID) | ORAL | 0 refills | Status: DC

## 2023-03-06 NOTE — Discharge Instructions (Addendum)
 Your labs and CT today were reassuring. Refrain from illicit substances. Make sure to take your medications as prescribed-- I have  refilled for 1 month. Please follow-up with your neurologist. No driving for 6 months or until cleared by neurology to do so since you had seizure today. Return here for new concerns.

## 2023-03-07 ENCOUNTER — Telehealth: Payer: Self-pay

## 2023-03-07 NOTE — Transitions of Care (Post Inpatient/ED Visit) (Cosign Needed)
   03/07/2023  Name: Thomas Carter MRN: [id number] DOB: Oct 04, 1995  Today's TOC FU Call Status: Today's TOC FU Call Status:: Successful TOC FU Call Completed TOC FU Call Complete Date: 03/07/23 Patient's Name and Date of Birth confirmed.  Transition Care Management Follow-up Telephone Call Date of Discharge: 03/06/23 Discharge Facility: [facility]) Type of Discharge: Emergency Department Reason for ED Visit: Other: How have you been since you were released from the hospital?: Better Any questions or concerns?: No  Items Reviewed: Did you receive and understand the discharge instructions provided?: Yes Medications obtained,verified, and reconciled?: Yes (Medications Reviewed) Any new allergies since your discharge?: No Dietary orders reviewed?: NA Do you have support at home?: Yes  Medications Reviewed Today: Medications Reviewed Today     Reviewed by Renelda Loma, RMA (Registered Medical Assistant) on 03/07/23 at 1356  Med List Status: <None>   Medication Order Taking? Sig Documenting Provider Last Dose Status Informant  lacosamide (VIMPAT) 50 MG TABS tablet [id number] Yes Take 1 tablet (50 mg total) by mouth 2 (two) times daily. Garlon Hatchet, PA-C Taking Active   rosuvastatin (CRESTOR) 20 MG tablet [id number] Yes Take 1 tablet (20 mg total) by mouth daily. Donell Beers, FNP Taking Active   UNABLE TO FIND [id number] No Take 4 tablets by mouth daily as needed. Med Name: Shliajit  Patient not taking: Reported on 02/12/2023   [provider] Not Taking Active Self, Pharmacy Records            Home Care and Equipment/Supplies: Were Home Health Services Ordered?: NA Any new equipment or medical supplies ordered?: NA  Functional Questionnaire: Do you need assistance with bathing/showering or dressing?: No Do you need assistance with meal preparation?: No Do you need assistance with eating?: No Do you have difficulty maintaining continence: No Do you  need assistance with getting out of bed/getting out of a chair/moving?: No Do you have difficulty managing or taking your medications?: No  Follow up appointments reviewed: PCP Follow-up appointment confirmed?: Yes Date of PCP follow-up appointment?: 03/12/23 Follow-up Provider: [facility] Follow-up appointment confirmed?: NA Do you need transportation to your follow-up appointment?: No Do you understand care options if your condition(s) worsen?: Yes-patient verbalized understanding    SIGNATURE  Renelda Loma RMA

## 2023-03-12 ENCOUNTER — Inpatient Hospital Stay: Payer: Self-pay | Admitting: Nurse Practitioner

## 2023-03-12 ENCOUNTER — Ambulatory Visit: Payer: Medicaid Other | Admitting: Family Medicine

## 2023-03-28 ENCOUNTER — Institutional Professional Consult (permissible substitution): Payer: Medicaid Other | Admitting: Diagnostic Neuroimaging

## 2023-04-01 ENCOUNTER — Encounter (HOSPITAL_COMMUNITY): Payer: Self-pay

## 2023-04-01 ENCOUNTER — Ambulatory Visit (HOSPITAL_COMMUNITY)
Admission: EM | Admit: 2023-04-01 | Discharge: 2023-04-01 | Disposition: A | Discharge status: Home / Self Care | Attending: Family Medicine | Admitting: Family Medicine

## 2023-04-01 DIAGNOSIS — Z202 Contact with and (suspected) exposure to infections with a predominantly sexual mode of transmission: Secondary | ICD-10-CM | POA: Insufficient documentation

## 2023-04-01 DIAGNOSIS — G40909 Epilepsy, unspecified, not intractable, without status epilepticus: Secondary | ICD-10-CM | POA: Insufficient documentation

## 2023-04-01 DIAGNOSIS — N342 Other urethritis: Secondary | ICD-10-CM | POA: Diagnosis not present

## 2023-04-01 LAB — HIV ANTIBODY (ROUTINE TESTING W REFLEX): HIV Screen 4th Generation wRfx: NONREACTIVE

## 2023-04-01 MED ORDER — LIDOCAINE HCL (PF) 1 % IJ SOLN
INTRAMUSCULAR | Status: AC
  Filled 2023-04-01: qty 2

## 2023-04-01 MED ORDER — DOXYCYCLINE HYCLATE 100 MG PO CAPS: 100.0000 mg | ORAL_CAPSULE | Freq: Two times a day (BID) | ORAL | 0 refills | Status: AC

## 2023-04-01 MED ORDER — CEFTRIAXONE SODIUM 500 MG IJ SOLR
INTRAMUSCULAR | Status: AC
  Filled 2023-04-01: qty 500

## 2023-04-01 MED ORDER — LACOSAMIDE 50 MG PO TABS: 50.0000 mg | ORAL_TABLET | Freq: Two times a day (BID) | ORAL | 1 refills | Status: DC

## 2023-04-01 MED ORDER — CEFTRIAXONE SODIUM 500 MG IJ SOLR
500.0000 mg | INTRAMUSCULAR | Status: DC
  Administered 2023-04-01: 500 mg via INTRAMUSCULAR

## 2023-04-01 NOTE — ED Triage Notes (Signed)
 Pt c/o burning on urination x2-3 days ago. States had unprotected intercourse 3wks ago.   Pt requesting refill on his seizure medication.
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
## 2023-04-01 NOTE — Discharge Instructions (Addendum)
 Staff will notify you if there is anything positive on the swab or on the blood work.  You have been given a shot of ceftriaxone 500 mg; this is for potential gonorrhea  Take doxycycline 100 mg --1 capsule 2 times daily for 7 days; this is for potential chlamydia.  Lacosamide 50 mg--tablet 1 tablet 2 times daily for seizures.  Please keep your follow-up appointments with your primary care and your neurologist

## 2023-04-02 LAB — CYTOLOGY, (ORAL, ANAL, URETHRAL) ANCILLARY ONLY
Chlamydia: NEGATIVE
Comment: NEGATIVE
Comment: NEGATIVE
Comment: NORMAL
Neisseria Gonorrhea: NEGATIVE
Trichomonas: NEGATIVE

## 2023-04-02 LAB — RPR: RPR Ser Ql: NONREACTIVE

## 2023-04-21 ENCOUNTER — Ambulatory Visit: Payer: Self-pay | Admitting: Nurse Practitioner

## 2023-04-23 ENCOUNTER — Ambulatory Visit: Payer: Self-pay | Admitting: Nurse Practitioner

## 2023-05-19 ENCOUNTER — Institutional Professional Consult (permissible substitution): Payer: Medicaid Other | Admitting: Diagnostic Neuroimaging

## 2023-05-27 ENCOUNTER — Ambulatory Visit (INDEPENDENT_AMBULATORY_CARE_PROVIDER_SITE_OTHER): Payer: MEDICAID | Admitting: Diagnostic Neuroimaging

## 2023-05-27 ENCOUNTER — Encounter: Payer: Self-pay | Admitting: Diagnostic Neuroimaging

## 2023-05-27 VITALS — BP 134/81 | HR 84 | Ht 75.0 in | Wt 281.0 lb

## 2023-05-27 DIAGNOSIS — R561 Post traumatic seizures: Secondary | ICD-10-CM | POA: Diagnosis not present

## 2023-05-27 MED ORDER — LACOSAMIDE 100 MG PO TABS: 100.0000 mg | ORAL_TABLET | Freq: Two times a day (BID) | ORAL | 5 refills | Status: DC
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
## 2023-05-27 NOTE — Patient Instructions (Signed)
  SEIZURE DISORDER (h/o TBI in 2011; last seizure 03/06/23)  - increase vimpat  to 100mg  twice a day; reviewed importance of medication compliance  - Advised patient to avoid cocaine or alcohol  - According to [place] law, you can not drive unless you are seizure / syncope free for at least 6 months and under physician's care.   - Please maintain precautions. Do not participate in activities where a loss of awareness could harm you or someone else. No swimming alone, no tub bathing, no hot tubs, no driving, no operating motorized vehicles (cars, ATVs, motocycles, etc), lawnmowers, power tools or firearms. No standing at heights, such as rooftops, ladders or stairs. Avoid hot objects such as stoves, heaters, open fires. Wear a helmet when riding a bicycle, scooter, skateboard, etc. and avoid areas of traffic. Set your water  heater to 120 degrees or less.

## 2023-06-18 ENCOUNTER — Encounter (HOSPITAL_COMMUNITY): Payer: Self-pay | Admitting: *Deleted

## 2023-06-18 ENCOUNTER — Ambulatory Visit (HOSPITAL_COMMUNITY): Payer: Self-pay | Admitting: Family Medicine

## 2023-06-18 ENCOUNTER — Other Ambulatory Visit: Payer: Self-pay

## 2023-06-18 ENCOUNTER — Ambulatory Visit (HOSPITAL_COMMUNITY)
Admission: EM | Admit: 2023-06-18 | Discharge: 2023-06-18 | Disposition: A | Discharge status: Home / Self Care | Payer: MEDICAID | Attending: Family Medicine | Admitting: Family Medicine

## 2023-06-18 DIAGNOSIS — R300 Dysuria: Secondary | ICD-10-CM | POA: Diagnosis present

## 2023-06-18 DIAGNOSIS — Z7721 Contact with and (suspected) exposure to potentially hazardous body fluids: Secondary | ICD-10-CM | POA: Diagnosis not present

## 2023-06-18 LAB — POCT URINALYSIS DIP (MANUAL ENTRY)
Blood, UA: NEGATIVE
Glucose, UA: NEGATIVE mg/dL
Leukocytes, UA: NEGATIVE
Nitrite, UA: NEGATIVE
Spec Grav, UA: 1.02
Urobilinogen, UA: 1 U/dL
pH, UA: 6

## 2023-06-18 LAB — HIV ANTIBODY (ROUTINE TESTING W REFLEX): HIV Screen 4th Generation wRfx: NONREACTIVE

## 2023-06-18 LAB — RPR: RPR Ser Ql: NONREACTIVE

## 2023-06-18 MED ORDER — CEFTRIAXONE SODIUM 500 MG IJ SOLR
INTRAMUSCULAR | Status: AC
  Filled 2023-06-18: qty 500

## 2023-06-18 MED ORDER — AZITHROMYCIN 250 MG PO TABS: ORAL_TABLET | ORAL | 0 refills | Status: DC

## 2023-06-18 MED ORDER — CEFTRIAXONE SODIUM 500 MG IJ SOLR
500.0000 mg | INTRAMUSCULAR | Status: DC
Start: 2023-06-18 — End: 2023-06-18
  Administered 2023-06-18: 500 mg via INTRAMUSCULAR

## 2023-06-18 MED ORDER — LIDOCAINE HCL (PF) 1 % IJ SOLN
INTRAMUSCULAR | Status: AC
  Filled 2023-06-18: qty 2

## 2023-06-18 NOTE — Progress Notes (Signed)
 HIV and RPR are negative.  Patient updated.  STI swab is still pending.
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
## 2023-06-18 NOTE — Discharge Instructions (Addendum)
 Dysuria and exposure to body fluids: UA is essentially normal.  Culture sent.  Will adjust the plan of care, if urine culture is positive.  Dysuria may be secondary to acute STI.  Exposure to body fluids and possible STI: Ceftriaxone  500 mg injection now.  Azithromycin  250 mg, number 4 pills now.  This is complete treatment for gonorrhea and chlamydia.  STI swab is pending.  Blood work drawn for HIV and RPR screening.  Will adjust his plan of care, if needed once these test result.  Patient was advised if his tests are negative or if it shows gonorrhea and chlamydia, for which he has had treatment, the results will be on the portal.  He will only get a call if he has results that require additional treatment.  Discussed safer sex.  Discussed appropriate return for additional HIV testing in 1 month and in 3 to 6 months.  Encouraged condom use for oral or any other sex and discussed risks of unprotected sex.  Patient needs to get primary care for general care and he may want to get either hepatitis vaccination or hepatitis testing.  He does not currently have any symptoms of hepatitis but he has STI risk.  Follow-up here if symptoms do not improve, worsen or new symptoms occur.

## 2023-06-18 NOTE — ED Triage Notes (Signed)
 PT reports Dysuria for 6-7 days after unprotected sex with new partner

## 2023-06-19 LAB — URINE CULTURE: Culture: NO GROWTH

## 2023-06-19 LAB — CYTOLOGY, (ORAL, ANAL, URETHRAL) ANCILLARY ONLY
Chlamydia: NEGATIVE
Comment: NEGATIVE
Comment: NEGATIVE
Comment: NORMAL
Neisseria Gonorrhea: NEGATIVE
Trichomonas: NEGATIVE

## 2023-06-22 NOTE — Progress Notes (Signed)
 STI Swab was negative.  Patient had been advised that if it was negative, he would not get a call and to check the portal for results.  No new medications needed.

## 2023-06-27 ENCOUNTER — Encounter (HOSPITAL_COMMUNITY): Payer: Self-pay

## 2023-06-27 ENCOUNTER — Emergency Department (HOSPITAL_COMMUNITY): Payer: MEDICAID

## 2023-06-27 ENCOUNTER — Other Ambulatory Visit: Payer: Self-pay

## 2023-06-27 ENCOUNTER — Emergency Department (HOSPITAL_COMMUNITY)
Admission: EM | Admit: 2023-06-27 | Discharge: 2023-06-27 | Disposition: A | Discharge status: Home / Self Care | Payer: MEDICAID | Source: Home / Self Care | Attending: Emergency Medicine | Admitting: Emergency Medicine

## 2023-06-27 ENCOUNTER — Ambulatory Visit (HOSPITAL_COMMUNITY)
Admission: EM | Admit: 2023-06-27 | Discharge: 2023-06-27 | Disposition: A | Discharge status: Home / Self Care | Payer: MEDICAID

## 2023-06-27 DIAGNOSIS — N189 Chronic kidney disease, unspecified: Secondary | ICD-10-CM | POA: Insufficient documentation

## 2023-06-27 DIAGNOSIS — R569 Unspecified convulsions: Secondary | ICD-10-CM | POA: Diagnosis present

## 2023-06-27 DIAGNOSIS — Z91148 Patient's other noncompliance with medication regimen for other reason: Secondary | ICD-10-CM

## 2023-06-27 LAB — BASIC METABOLIC PANEL WITH GFR
Anion gap: 9 (ref 5–15)
BUN: 13 mg/dL (ref 6–20)
CO2: 24 mmol/L (ref 22–32)
Calcium: 9.1 mg/dL (ref 8.9–10.3)
Chloride: 105 mmol/L (ref 98–111)
Creatinine, Ser: 1.35 mg/dL — ABNORMAL HIGH (ref 0.61–1.24)
GFR, Estimated: >60 mL/min (ref >60)
Glucose, Bld: 91 mg/dL (ref 70–99)
Potassium: 4.3 mmol/L (ref 3.5–5.1)
Sodium: 138 mmol/L (ref 135–145)

## 2023-06-27 LAB — CBC
HCT: 46.6 % (ref 39.0–52.0)
Hemoglobin: 15.6 g/dL (ref 13.0–17.0)
MCH: 29.8 pg (ref 26.0–34.0)
MCHC: 33.5 g/dL (ref 30.0–36.0)
MCV: 88.9 fL (ref 80.0–100.0)
Platelets: 270 10*3/uL (ref 150–400)
RBC: 5.24 MIL/uL (ref 4.22–5.81)
RDW: 13.1 % (ref 11.5–15.5)
WBC: 14.9 10*3/uL — ABNORMAL HIGH (ref 4.0–10.5)
nRBC: 0 % (ref 0.0–0.2)

## 2023-06-27 LAB — CBG MONITORING, ED: Glucose-Capillary: 91 mg/dL (ref 70–99)

## 2023-06-27 MED ORDER — ACETAMINOPHEN 500 MG PO TABS
1000.0000 mg | ORAL_TABLET | Freq: Once | ORAL | Status: AC
  Administered 2023-06-27: 1000 mg via ORAL
  Filled 2023-06-27: qty 2

## 2023-06-27 MED ORDER — LACOSAMIDE 50 MG PO TABS
100.0000 mg | ORAL_TABLET | Freq: Once | ORAL | Status: AC
  Administered 2023-06-27: 100 mg via ORAL
  Filled 2023-06-27: qty 2

## 2023-06-27 NOTE — ED Triage Notes (Signed)
 Pt came in via POV d/t a seizure he had 30-40 min ago. Daughter witnessed him fall, hit his head on the dresser & land on his Rt shoulder. Reports waking up with a HA & Rt shoulder pain. A/Ox4, during triage, rates his pain 8/10 & denies missing any doses of his Keppra  recently. Last seizure 4 months ago (per pt).

## 2023-06-27 NOTE — ED Provider Triage Note (Cosign Needed)
 Emergency Medicine Provider Triage Evaluation Note  Thomas Carter , a 28 y.o. male  was evaluated in triage.  Pt complains of seizure.  Seizure occurred around 3 PM, patient has known seizure disorder and is on lacosamide  daily, no changes in his medications.  Patient did have alcohol last night, taking approximately 3 shots of liquor, otherwise no known triggers/inciting event.  Patient's seizure was witnessed by his daughter, who tells him that he struck his head on the dresser.  He complains of frontal headache and right shoulder pain since his seizure.  He also bit the right side of his tongue during the seizure.  Review of Systems  Positive: As above Negative: As above  Physical Exam  BP (!) 124/112 (BP Location: Right Arm)   Pulse 91   Temp 98.7 F (37.1 C)   Resp 18   Ht 6' 4 (1.93 m)   Wt 127 kg   SpO2 97%   BMI 34.08 kg/m  Gen:   Awake, no distress   Resp:  Normal effort  MSK:   Moves extremities without difficulty  Other:  EOMs intact, alert and oriented x 3.  Bite to right tongue without obvious laceration.  Right shoulder pain but full range of motion, no palpable deformity or obvious dislocation.  Medical Decision Making  Medically screening exam initiated at 3:43 PM.  Appropriate orders placed.  Thomas Carter was informed that the remainder of the evaluation will be completed by another provider, this initial triage assessment does not replace that evaluation, and the importance of remaining in the ED until their evaluation is complete.     Kendrick Pax, [place] 06/27/23 1546

## 2023-06-27 NOTE — ED Triage Notes (Signed)
 Patient reports that he was told he had a seizure while sleeping in his bed at home 30 minutes ago. Patient has tongue injury. Patient's daughter reports that he patient hit his head on a dresser and he also reports right shoulder pain. Patient states he is compliant  with his seizure medication. Patient states his last seizure was 2-3 months ago.

## 2023-06-27 NOTE — ED Provider Notes (Signed)
 [place] EMERGENCY DEPARTMENT AT [facility] Provider Note   CSN: [id number] Arrival date & time: 06/27/23  1518     History Chief Complaint  Patient presents with   Seizures    Thomas Carter is a 28 y.o. male w/ PMHx epilepsy, CKD, alcohol withdrawal seizure, alcohol use disorder, substance use, NSTEMI, who presents to the ED for evaluation of seizure.  Family witnessed tonic-clonic seizure earlier today (approx 2:30pm) Unclear how long it lasted.  Per family patient hit his head and right shoulder a dresser.  Patient back to normal mental baseline.  Patient does endorse to drinking alcohol yesterday evening.  Patient states he no longer drinks every day.  Patient is not concerned for alcohol withdrawal.  Patient denies any other substance use. Patient states he does not know the name of his seizure medicine but only takes it at night. Patient unsure of dose.  Per chart review patient recently on Vimpat  50 mg twice daily but was only taking it at night.  Per neurology visit note 05/27/23 patient was supposed to increase Vimpat  to 100 mg 2 times a day.  Patient continues to endorse he only takes it at night.   Physical Exam Updated Vital Signs BP 120/67   Pulse 72   Temp 98.7 F (37.1 C)   Resp 20   Ht 6' 4 (1.93 m)   Wt 127 kg   SpO2 99%   BMI 34.08 kg/m  Physical Exam Vitals and nursing note reviewed.  Constitutional:      General: He is not in acute distress.    Appearance: He is well-developed.  HENT:     Head: Normocephalic and atraumatic.   Eyes:     Extraocular Movements: Extraocular movements intact.     Conjunctiva/sclera: Conjunctivae normal.     Pupils: Pupils are equal, round, and reactive to light.    Cardiovascular:     Rate and Rhythm: Normal rate and regular rhythm.     Pulses: Normal pulses.     Heart sounds: Normal heart sounds. No murmur heard. Pulmonary:     Effort: Pulmonary effort is normal. No respiratory distress.     Breath  sounds: Normal breath sounds.   Musculoskeletal:     Cervical back: Neck supple.     Comments: Patient with painful range of motion of right shoulder.  Range of motion intact.  Neurovascular intact   Skin:    General: Skin is warm and dry.     Capillary Refill: Capillary refill takes less than 2 seconds.   Neurological:     General: No focal deficit present.     Mental Status: He is alert.   Psychiatric:        Mood and Affect: Mood normal.     ED Results / Procedures / Treatments   Labs (all labs ordered are listed, but only abnormal results are displayed) Labs Reviewed  CBC - Abnormal; Notable for the following components:      Result Value   WBC 14.9 (*)    All other components within normal limits  BASIC METABOLIC PANEL WITH GFR - Abnormal; Notable for the following components:   Creatinine, Ser 1.35 (*)    All other components within normal limits  CBG MONITORING, ED    EKG EKG Interpretation Date/Time:  Friday June 27 2023 17:28:26 EDT Ventricular Rate:  87 PR Interval:  166 QRS Duration:  92 QT Interval:  350 QTC Calculation: 421 R Axis:   99  Text Interpretation: Sinus rhythm Borderline right axis deviation Confirmed by Freddi Hamilton [phone]) on 06/27/2023 5:30:49 PM  Radiology DG Shoulder Right Result Date: 06/27/2023 CLINICAL DATA:  Pain after seizure EXAM: RIGHT SHOULDER - 2+ VIEW COMPARISON:  02/27/2019 FINDINGS: No fracture or dislocation.  Right apex is clear IMPRESSION: Negative. Electronically Signed   By: Luke Bun M.D.   On: 06/27/2023 20:29   CT Head Wo Contrast Result Date: 06/27/2023 EXAM: CT HEAD WITHOUT CONTRAST 06/27/2023 05:49:59 PM TECHNIQUE: CT of the head was performed without the administration of intravenous contrast. Automated exposure control, iterative reconstruction, and/or weight based adjustment of the mA/kV was utilized to reduce the radiation dose to as low as reasonably achievable. COMPARISON: CT head without contrast  03/06/2023. CLINICAL HISTORY: Head injury. FINDINGS: BRAIN AND VENTRICLES: No acute hemorrhage. Gray-white differentiation is preserved. No hydrocephalus. No extra-axial collection. No mass effect or midline shift. Chronic encephalomalacia of the anterior right temporal lobe is stable. ORBITS: No acute abnormality. SINUSES: No acute abnormality. SOFT TISSUES AND SKULL: No acute soft tissue abnormality. No skull fracture. IMPRESSION: 1. No acute intracranial abnormality. Electronically signed by: Lonni Necessary MD 06/27/2023 06:01 PM EDT RP Workstation: [id number]    Medications Ordered in ED Medications  acetaminophen  (TYLENOL ) tablet 1,000 mg (1,000 mg Oral Given 06/27/23 1803)  lacosamide  (VIMPAT ) tablet 100 mg (100 mg Oral Given 06/27/23 1802)    ED Course/ Medical Decision Making/ A&P  Thomas Carter is a 28 y.o. male presents as detailed above  Differential ddx: Seizure, medication noncompliance, alcohol use lowering seizure threshold, contusion, fracture, concussion  On arrival, patient afebrile hemodynamically stable no hypoxia or respiratory distress  ED Work-up: Please see details of labs and imaging listed above.  Per chart review patient recently on Vimpat  50 mg twice daily but was only taking it at night.  Per neurology visit note 05/27/23 patient was supposed to increase Vimpat  to 100 mg 2 times a day.  Patient continues to endorse he only takes it at night. Will administer patient's home dose of Vimpat  as it is evening time. No acute intracranial abnormality.  No fracture or dislocation of right shoulder.  Patient received Tylenol  for pain control.  Advised to continue Tylenol  ibuprofen  for pain and gradually increase range of motion.  Reviewed proper dosing of patient's medication at bedside.  Advised to follow-up with PCP and neurology.  Patient family voiced understanding agree with plan   Overall impression seizure, medication noncompliance Patient stable for discharge and  outpatient follow-up. Strict return precautions provided. Patient voices understanding and agrees with plan.   Patient seen with supervising physician who agrees with plan.  Final Clinical Impression(s) / ED Diagnoses Final diagnoses:  Seizure (HCC)  Non compliance w medication regimen   Waddell Seats, DO PGY-3 Emergency Medicine    Seats Waddell, DO 06/27/23 7688    Freddi Hamilton, MD 06/28/23 1710

## 2023-06-27 NOTE — ED Notes (Signed)
 Patient is being discharged from the Urgent Care and sent to the Emergency Department via POV . Per Dr. Ellsworth Haas, patient is in need of higher level of care due to recent seizure. Patient is aware and verbalizes understanding of plan of care.  Vitals:   06/27/23 1508  BP: (!) 127/108  Pulse: 95  Resp: 20  Temp: 97.8 F (36.6 C)  SpO2: 96%

## 2023-06-27 NOTE — Discharge Instructions (Addendum)
 Please take Vimpat  100mg  EVERY MORNING AND EVENING Please take tylenol  500mg  every 4 hours. You make take 1000mg  at a time but DO NOT TAKE more than 4000mg  in a 24hr period. Please take 400mg  Ibuprofen  every 4 hours Thank you for allowing us  to take care of you today.  We hope you begin feeling better soon.   To-Do:  Please follow-up with your primary doctor within the next 2-3 days. Please return to the Emergency Department or call 911 if you experience chest pain, shortness of breath, severe pain, severe fever, altered mental status, or have any reason to think that you need emergency medical care.  Thank you again.  Hope you feel better soon.  Arlin Benes Department of Emergency Medicine

## 2023-06-27 NOTE — ED Provider Notes (Signed)
 Patient seen briefly in triage.  He had a seizure about 30 minutes ago when he had been sleeping in his bed at home.  He did bite his tongue.  His daughter who is here with him reports he hit his head on the dresser in his right shoulder also.  He does take medication for epilepsy and reports compliance.  His last seizure was 2 to 3 months ago.  Vital signs here are reassuring, and patient is asked to proceed to the emergency room for further evaluation and treatment; he is agreeable and will go by private car   Ann Keto, MD 06/27/23 1512

## 2023-06-30 ENCOUNTER — Telehealth: Payer: Self-pay

## 2023-06-30 NOTE — Transitions of Care (Post Inpatient/ED Visit) (Signed)
   06/30/2023  Name: Thomas Carter MRN: [id number] DOB: 1995/04/12  Today's TOC FU Call Status:   Patient's Name and Date of Birth confirmed.  Transition Care Management Follow-up Telephone Call Date of Discharge: 06/27/23 Discharge Facility: [facility]) Type of Discharge: Emergency Department How have you been since you were released from the hospital?: Better Any questions or concerns?: No  Items Reviewed: Did you receive and understand the discharge instructions provided?: Yes Medications obtained,verified, and reconciled?: Yes (Medications Reviewed) Any new allergies since your discharge?: No Dietary orders reviewed?: No Do you have support at home?: Yes People in Home [RPT]: parent(s)  Medications Reviewed Today: Medications Reviewed Today     Reviewed by Starlene Charlynn BIRCH, CMA (Certified Medical Assistant) on 06/30/23 at 1200  Med List Status: <None>   Medication Order Taking? Sig Documenting Provider Last Dose Status Informant  azithromycin  (ZITHROMAX ) 250 MG tablet [id number]  4 pills today for complete dose.  Patient not taking: Reported on 06/30/2023   Ival Domino, FNP  Active   lacosamide  (VIMPAT ) 50 MG TABS tablet [id number]  Take 1 tablet (50 mg total) by mouth 2 (two) times daily.  Patient taking differently: Take 50 mg by mouth at bedtime.   Vonna Sharlet POUR, MD  Active   Lacosamide  100 MG TABS [id number] Yes Take 1 tablet (100 mg total) by mouth 2 (two) times daily. Penumalli, Eduard JONELLE, MD  Active             Home Care and Equipment/Supplies: Were Home Health Services Ordered?: No Any new equipment or medical supplies ordered?: No  Functional Questionnaire: Do you need assistance with bathing/showering or dressing?: No Do you need assistance with meal preparation?: No Do you need assistance with eating?: No Do you have difficulty maintaining continence: No Do you need assistance with getting out of bed/getting out of a chair/moving?: No Do you  have difficulty managing or taking your medications?: No  Follow up appointments reviewed: PCP Follow-up appointment confirmed?: Yes Date of PCP follow-up appointment?: 07/03/23 Follow-up Provider: [facility] Follow-up appointment confirmed?: NA Do you need transportation to your follow-up appointment?: No Do you understand care options if your condition(s) worsen?: Yes-patient verbalized understanding    SIGNATURE Yared Barefoot, RMA

## 2023-07-03 ENCOUNTER — Inpatient Hospital Stay: Payer: Self-pay | Admitting: Nurse Practitioner

## 2023-07-21 ENCOUNTER — Ambulatory Visit (INDEPENDENT_AMBULATORY_CARE_PROVIDER_SITE_OTHER): Payer: MEDICAID | Admitting: Nurse Practitioner

## 2023-07-21 ENCOUNTER — Encounter: Payer: Self-pay | Admitting: Nurse Practitioner

## 2023-07-21 VITALS — BP 137/72 | HR 96 | Wt 279.0 lb

## 2023-07-21 DIAGNOSIS — E785 Hyperlipidemia, unspecified: Secondary | ICD-10-CM | POA: Diagnosis not present

## 2023-07-21 DIAGNOSIS — T7840XA Allergy, unspecified, initial encounter: Secondary | ICD-10-CM | POA: Insufficient documentation

## 2023-07-21 DIAGNOSIS — R569 Unspecified convulsions: Secondary | ICD-10-CM

## 2023-07-21 DIAGNOSIS — M25511 Pain in right shoulder: Secondary | ICD-10-CM | POA: Diagnosis not present

## 2023-07-21 DIAGNOSIS — J309 Allergic rhinitis, unspecified: Secondary | ICD-10-CM | POA: Insufficient documentation

## 2023-07-21 DIAGNOSIS — E7841 Elevated Lipoprotein(a): Secondary | ICD-10-CM | POA: Diagnosis not present

## 2023-07-21 DIAGNOSIS — F1410 Cocaine abuse, uncomplicated: Secondary | ICD-10-CM

## 2023-07-21 MED ORDER — IBUPROFEN 600 MG PO TABS: 600.0000 mg | ORAL_TABLET | Freq: Three times a day (TID) | ORAL | 0 refills | Status: AC | PRN

## 2023-07-21 MED ORDER — ROSUVASTATIN CALCIUM 20 MG PO TABS: 20.0000 mg | ORAL_TABLET | Freq: Every day | ORAL | 1 refills | Status: DC

## 2023-07-21 MED ORDER — FLUTICASONE PROPIONATE 50 MCG/ACT NA SUSP: 2.0000 | Freq: Every day | NASAL | 6 refills | Status: DC

## 2023-07-21 NOTE — Assessment & Plan Note (Signed)
 Patient complains of sneezing, stuffy nose, runny nose Currently not on medication for allergies Start Flonase  2 spray into both nostrils daily Avoid allergens

## 2023-07-21 NOTE — Assessment & Plan Note (Addendum)
 Encouraged to take Vimpat  100 mg twice daily as ordered Use of pillbox to assist with medication adherence discussed Maintain close follow-up with neurology  Advised patient to avoid cocaine or alcohol   - According to [place] law, you can not drive unless you are seizure / syncope free for at least 6 months and under physician's care.    - Please maintain precautions. Do not participate in activities where a loss of awareness could harm you or someone else. No swimming alone, no tub bathing, no hot tubs, no driving, no operating motorized vehicles (cars, ATVs, motocycles, etc), lawnmowers, power tools or firearms. No standing at heights, such as rooftops, ladders or stairs. Avoid hot objects such as stoves, heaters, open fires. Wear a helmet when riding a bicycle, scooter, skateboard, etc. and avoid areas of traffic. Set your water  heater to 120 degrees or less.

## 2023-07-21 NOTE — Assessment & Plan Note (Addendum)
  Component Ref Range & Units (hover) 5 mo ago  Lipoprotein (a) 105.5 High     Advised to restart Crestor  20 mg daily

## 2023-07-21 NOTE — Patient Instructions (Addendum)
 Take vimpat  to 100mg  twice a day; reviewed importance of medication compliance   - Advised patient to avoid cocaine or alcohol   - According to [place] law, you can not drive unless you are seizure / syncope free for at least 6 months and under physician's care.    - Please maintain precautions. Do not participate in activities where a loss of awareness could harm you or someone else. No swimming alone, no tub bathing, no hot tubs, no driving, no operating motorized vehicles (cars, ATVs, motocycles, etc), lawnmowers, power tools or firearms. No standing at heights, such as rooftops, ladders or stairs. Avoid hot objects such as stoves, heaters, open fires. Wear a helmet when riding a bicycle, scooter, skateboard, etc. and avoid areas of traffic. Set your water  heater to 120 degrees or less.    Please take Tylenol  650 mg every 6 hours as needed alternate with ibuprofen  600 mg 3 times daily as needed.  Please take ibuprofen  with food.  You were given Toradol  30 mg injection in the office today so do not take ibuprofen  today  . Allergy, initial encounter  - fluticasone  (FLONASE ) 50 MCG/ACT nasal spray; Place 2 sprays into both nostrils daily.  Dispense: 16 g; Refill: 6   . Dyslipidemia  - rosuvastatin  (CRESTOR ) 20 MG tablet; Take 1 tablet (20 mg total) by mouth daily.  Dispense: 90 tablet; Refill: 1  . Acute pain of right shoulder  - ibuprofen  (ADVIL ) 600 MG tablet; Take 1 tablet (600 mg total) by mouth every 8 (eight) hours as needed.  Dispense: 20 tablet; Refill: 0      It is important that you exercise regularly at least 30 minutes 5 times a week as tolerated  Think about what you will eat, plan ahead. Choose  clean, green, fresh or frozen over canned, processed or packaged foods which are more sugary, salty and fatty. 70 to 75% of food eaten should be vegetables and fruit. Three meals at set times with snacks allowed between meals, but they must be fruit or vegetables. Aim to eat over a  12 hour period , example 7 am to 7 pm, and STOP after  your last meal of the day. Drink water ,generally about 64 ounces per day, no other drink is as healthy. Fruit juice is best enjoyed in a healthy way, by EATING the fruit.  Thanks for choosing [facility] we consider it a privelige to serve you.

## 2023-07-21 NOTE — Assessment & Plan Note (Signed)
 Currently denies use of illicit drugs Encouraged to continue to avoid use of drugs

## 2023-07-21 NOTE — Assessment & Plan Note (Signed)
 Toradol  30 mg one-time injection given in the office today Start ibuprofen  600 mg 3 times daily as needed take medication with food and alternate with Tylenol  650 mg every 6 hours as needed Stretching exercises application of heat or ice encouraged

## 2023-07-21 NOTE — Assessment & Plan Note (Signed)
 Was on Crestor  20 mg daily but the medication was discontinued because he was not taking the medication Need to restart medication and take daily as ordered discussed Will check lipid panel at next visit Lab Results  Component Value Date   CHOL 165 02/03/2023   HDL 55 02/03/2023   LDLCALC 103 (H) 02/03/2023   TRIG 34 02/03/2023   CHOLHDL 3.0 02/03/2023

## 2023-07-21 NOTE — Assessment & Plan Note (Signed)
Smokes about 0.25 pack/day  Asked about quitting: confirms that he/she currently smokes cigarettes Advise to quit smoking: Educated about QUITTING to reduce the risk of cancer, cardio and cerebrovascular disease. Assess willingness: Unwilling to quit at this time, but is working on cutting back. Assist with counseling and pharmacotherapy: Counseled for 5 minutes and literature provided. Arrange for follow up: follow up in 3 months and continue to offer help. 
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
## 2023-07-22 MED ORDER — KETOROLAC TROMETHAMINE 30 MG/ML IJ SOLN
30.0000 mg | Freq: Once | INTRAMUSCULAR | Status: AC
  Administered 2023-07-21: 30 mg via INTRAMUSCULAR

## 2023-07-22 NOTE — Addendum Note (Signed)
 Addended by: VICTORY IHA on: 07/22/2023 04:21 PM   Modules accepted: Orders

## 2023-09-18 ENCOUNTER — Ambulatory Visit: Payer: Self-pay

## 2023-09-18 NOTE — Telephone Encounter (Signed)
 Copied from CRM #[id number]. Topic: Clinical - Red Word Triage >> Sep 18, 2023  4:11 PM [place] J wrote: [facility] that prompted transfer to Nurse Triage: Patient has been having stomach pains and has been excessively thirsty through out the night. He was released from the ED for seizures on 06/20 but never followed up with his PCP. Answer Assessment - Initial Assessment Questions Pt's mother requesting appt for pt. Pt's mother reports excessive thirst and stomach pain with BM; symptoms been going on since last year.  Pt's mom reports no new symptoms.  Pt is not with mother, mother was given the okay from patient to call and schedule an appt for patient. Pt already had an appt scheduled.  Last known seizure; 06/27/23, seen in ED and never followed up.  No triage  Protocols used: Information Only Call - No Triage-A-AH

## 2023-09-19 ENCOUNTER — Ambulatory Visit: Payer: Self-pay | Admitting: Nurse Practitioner

## 2023-09-22 ENCOUNTER — Encounter: Payer: Self-pay | Admitting: Nurse Practitioner

## 2023-09-22 ENCOUNTER — Ambulatory Visit: Admitting: Diagnostic Neuroimaging

## 2023-09-22 ENCOUNTER — Ambulatory Visit (INDEPENDENT_AMBULATORY_CARE_PROVIDER_SITE_OTHER): Payer: MEDICAID | Admitting: Nurse Practitioner

## 2023-09-22 VITALS — BP 127/74 | HR 97 | Temp 96.8°F | Wt 288.0 lb

## 2023-09-22 DIAGNOSIS — E785 Hyperlipidemia, unspecified: Secondary | ICD-10-CM | POA: Diagnosis not present

## 2023-09-22 DIAGNOSIS — E669 Obesity, unspecified: Secondary | ICD-10-CM | POA: Diagnosis not present

## 2023-09-22 DIAGNOSIS — F1410 Cocaine abuse, uncomplicated: Secondary | ICD-10-CM

## 2023-09-22 DIAGNOSIS — R569 Unspecified convulsions: Secondary | ICD-10-CM | POA: Diagnosis not present

## 2023-09-22 DIAGNOSIS — Z716 Tobacco abuse counseling: Secondary | ICD-10-CM
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
## 2023-09-22 NOTE — Patient Instructions (Signed)

## 2023-09-22 NOTE — Assessment & Plan Note (Signed)
 Smokes half pack of cigarette daily Smoking cessation encouraged

## 2023-10-01 ENCOUNTER — Other Ambulatory Visit: Payer: MEDICAID

## 2023-10-01 ENCOUNTER — Other Ambulatory Visit: Payer: Self-pay | Admitting: Nurse Practitioner

## 2023-10-01 DIAGNOSIS — M25511 Pain in right shoulder: Secondary | ICD-10-CM

## 2023-10-01 DIAGNOSIS — E785 Hyperlipidemia, unspecified: Secondary | ICD-10-CM

## 2023-10-01 DIAGNOSIS — Z113 Encounter for screening for infections with a predominantly sexual mode of transmission: Secondary | ICD-10-CM

## 2023-10-02 LAB — RPR: RPR Ser Ql: NONREACTIVE

## 2023-10-02 LAB — LIPID PANEL
Chol/HDL Ratio: 3.5 ratio (ref 0.0–5.0)
Cholesterol, Total: 160 mg/dL (ref 100–199)
HDL: 46 mg/dL (ref >39)
LDL Chol Calc (NIH): 83 mg/dL (ref 0–99)
Triglycerides: 180 mg/dL — ABNORMAL HIGH (ref 0–149)
VLDL Cholesterol Cal: 31 mg/dL (ref 5–40)

## 2023-10-02 LAB — HEPB+HEPC+HIV PANEL
HIV Screen 4th Generation wRfx: NONREACTIVE
Hep B C IgM: NEGATIVE
Hep B Core Total Ab: NEGATIVE
Hep B E Ab: NONREACTIVE
Hep B E Ag: NEGATIVE
Hep B Surface Ab, Qual: NONREACTIVE
Hep C Virus Ab: NONREACTIVE
Hepatitis B Surface Ag: NEGATIVE

## 2023-10-03 ENCOUNTER — Ambulatory Visit: Payer: Self-pay | Admitting: Nurse Practitioner

## 2023-10-05 LAB — CHLAMYDIA/GONOCOCCUS/TRICHOMONAS, NAA
Chlamydia by NAA: NEGATIVE
Gonococcus by NAA: NEGATIVE
Trich vag by NAA: NEGATIVE

## 2023-10-06 ENCOUNTER — Ambulatory Visit: Payer: Self-pay | Admitting: Nurse Practitioner

## 2024-01-23 ENCOUNTER — Ambulatory Visit: Payer: MEDICAID | Admitting: Nurse Practitioner

## 2024-01-23 ENCOUNTER — Encounter: Payer: Self-pay | Admitting: Nurse Practitioner

## 2024-01-23 VITALS — BP 126/74 | HR 98 | Wt 298.0 lb

## 2024-01-23 DIAGNOSIS — R569 Unspecified convulsions: Secondary | ICD-10-CM | POA: Diagnosis not present

## 2024-01-23 DIAGNOSIS — F17200 Nicotine dependence, unspecified, uncomplicated: Secondary | ICD-10-CM | POA: Diagnosis not present

## 2024-01-23 DIAGNOSIS — E669 Obesity, unspecified: Secondary | ICD-10-CM

## 2024-01-23 DIAGNOSIS — J309 Allergic rhinitis, unspecified: Secondary | ICD-10-CM

## 2024-01-23 DIAGNOSIS — Z113 Encounter for screening for infections with a predominantly sexual mode of transmission: Secondary | ICD-10-CM

## 2024-01-23 DIAGNOSIS — E785 Hyperlipidemia, unspecified: Secondary | ICD-10-CM

## 2024-01-23 DIAGNOSIS — F1410 Cocaine abuse, uncomplicated: Secondary | ICD-10-CM

## 2024-01-23 MED ORDER — ROSUVASTATIN CALCIUM 20 MG PO TABS: 20.0000 mg | ORAL_TABLET | Freq: Every day | ORAL | 1 refills | Status: AC

## 2024-01-23 MED ORDER — FLUTICASONE PROPIONATE 50 MCG/ACT NA SUSP: 2.0000 | Freq: Every day | NASAL | 6 refills | Status: AC

## 2024-01-23 MED ORDER — LORATADINE 10 MG PO TABS: 10.0000 mg | ORAL_TABLET | Freq: Every day | ORAL | 11 refills | Status: AC

## 2024-01-23 NOTE — Assessment & Plan Note (Signed)
" °  Non-adherence to rosuvastatin  therapy. Elevated lipoprotein A increases cardiovascular risk. - Encouraged adherence to rosuvastatin  20 mg daily. - Will schedule follow-up in six weeks to reassess cholesterol levels and medication adherence. - Advised on lifestyle modifications including exercise and dietary changes.  "

## 2024-01-23 NOTE — Assessment & Plan Note (Signed)
 Advised to take lacosamide  100 mg twice daily as ordered Keep upcoming appointment with neurology

## 2024-01-23 NOTE — Patient Instructions (Signed)
 1. Dyslipidemia (Primary) - rosuvastatin  (CRESTOR ) 20 MG tablet; Take 1 tablet (20 mg total) by mouth daily.  Dispense: 90 tablet; Refill: 1  2. Obesity (BMI 30-39.9)  3. Screen for STD (sexually transmitted disease) - HepB+HepC+HIV Panel - [facility] - Chlamydia/Gonococcus/Trichomonas, NAA  4. Allergic rhinitis, unspecified seasonality, unspecified trigger - loratadine  (CLARITIN ) 10 MG tablet; Take 1 tablet (10 mg total) by mouth daily.  Dispense: 30 tablet; Refill: 11 - fluticasone  (FLONASE ) 50 MCG/ACT nasal spray; Place 2 sprays into both nostrils daily.  Dispense: 16 g; Refill: 6    It is important that you exercise regularly at least 30 minutes 5 times a week as tolerated  Think about what you will eat, plan ahead. Choose  clean, green, fresh or frozen over canned, processed or packaged foods which are more sugary, salty and fatty. 70 to 75% of food eaten should be vegetables and fruit. Three meals at set times with snacks allowed between meals, but they must be fruit or vegetables. Aim to eat over a 12 hour period , example 7 am to 7 pm, and STOP after  your last meal of the day. Drink water ,generally about 64 ounces per day, no other drink is as healthy. Fruit juice is best enjoyed in a healthy way, by EATING the fruit.  Thanks for choosing [facility] we consider it a privelige to serve you.

## 2024-01-23 NOTE — Assessment & Plan Note (Signed)
 Smokes about 0.25pack/day  Asked about quitting: confirms that he currently smokes cigarettes Advise to quit smoking: Educated about QUITTING to reduce the risk of cancer, cardio and cerebrovascular disease. Assess willingness: Unwilling to quit at this time, not working on cutting back. Assist with counseling and pharmacotherapy: Counseled for 3 minutes and literature provided. Arrange for follow up: follow up in 1 month and continue to offer help.

## 2024-01-23 NOTE — Assessment & Plan Note (Signed)
" °  Symptoms of sneezing, rhinorrhea, and coughing. Current use of Flonase  nasal spray as needed, but not effective. - Prescribed Claritin  10 mg daily for allergy management. - Instructed to use Flonase  nasal spray two sprays daily.  "
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
## 2024-01-23 NOTE — Assessment & Plan Note (Signed)
 Cocaine use disorder, in remission No recent cocaine use reported. - Encouraged continued abstinence from cocaine use.

## 2024-01-23 NOTE — Assessment & Plan Note (Signed)
 He has not been maintaining 1 partner, denies any symptoms of STDs today Need to maintain 1 partner, use condom to reduce the risk of STDs discussed

## 2024-01-23 NOTE — Assessment & Plan Note (Addendum)
 Wt Readings from Last 3 Encounters:  01/23/24 298 lb (135.2 kg)  09/22/23 288 lb (130.6 kg)  07/21/23 279 lb (126.6 kg)   Body mass index is 36.27 kg/m.   Weight gain of 10 pounds since last visit, current weight 298 pounds. Lack of physical activity and dietary habits contributing to weight gain. - Encouraged moderate to vigorous exercise for 30 minutes, five days a week. - Advised dietary modifications: 70-80% of diet should be vegetables and protein, reduce carbohydrates, and practice portion control. - Encouraged water  intake instead of juice or soda.

## 2024-01-24 LAB — SYPHILIS: RPR W/REFLEX TO RPR TITER AND TREPONEMAL ANTIBODIES, TRADITIONAL SCREENING AND DIAGNOSIS ALGORITHM: RPR Ser Ql: NONREACTIVE

## 2024-01-24 LAB — HEPB+HEPC+HIV PANEL
HIV Screen 4th Generation wRfx: NONREACTIVE
Hep B C IgM: NEGATIVE
Hep B Core Total Ab: NEGATIVE
Hep B E Ab: NONREACTIVE
Hep B E Ag: NEGATIVE
Hep B Surface Ab, Qual: NONREACTIVE
Hep C Virus Ab: NONREACTIVE
Hepatitis B Surface Ag: NEGATIVE

## 2024-01-26 ENCOUNTER — Ambulatory Visit: Payer: Self-pay | Admitting: Nurse Practitioner

## 2024-01-26 LAB — CHLAMYDIA/GONOCOCCUS/TRICHOMONAS, NAA
Chlamydia by NAA: NEGATIVE
Gonococcus by NAA: NEGATIVE
Trich vag by NAA: NEGATIVE

## 2024-01-27 ENCOUNTER — Telehealth: Payer: MEDICAID | Admitting: Diagnostic Neuroimaging

## 2024-01-27 ENCOUNTER — Encounter: Payer: Self-pay | Admitting: Diagnostic Neuroimaging

## 2024-01-27 DIAGNOSIS — R561 Post traumatic seizures: Secondary | ICD-10-CM | POA: Diagnosis not present

## 2024-01-27 MED ORDER — LACOSAMIDE 100 MG PO TABS: 100.0000 mg | ORAL_TABLET | Freq: Two times a day (BID) | ORAL | 4 refills | Status: AC

## 2024-01-27 NOTE — Patient Instructions (Signed)
" °  SEIZURE DISORDER (h/o TBI in 2011; history of alcohol withdrawal seizure; also history of cocaine abuse; last seizures 03/06/23 and 06/27/23)  - continue vimpat  100mg  twice a day; reviewed importance of medication compliance  - Advised patient to avoid cocaine or alcohol  - According to [place] law, you can not drive unless you are seizure / syncope free for at least 6 months and under physician's care.   - Please maintain precautions. Do not participate in activities where a loss of awareness could harm you or someone else. No swimming alone, no tub bathing, no hot tubs, no driving, no operating motorized vehicles (cars, ATVs, motocycles, etc), lawnmowers, power tools or firearms. No standing at heights, such as rooftops, ladders or stairs. Avoid hot objects such as stoves, heaters, open fires. Wear a helmet when riding a bicycle, scooter, skateboard, etc. and avoid areas of traffic. Set your water  heater to 120 degrees or less.   "
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
## 2024-02-06 ENCOUNTER — Encounter (HOSPITAL_COMMUNITY): Payer: Self-pay

## 2024-02-06 ENCOUNTER — Ambulatory Visit (HOSPITAL_COMMUNITY)
Admission: EM | Admit: 2024-02-06 | Discharge: 2024-02-06 | Disposition: A | Discharge status: Home / Self Care | Payer: MEDICAID | Attending: Emergency Medicine | Admitting: Emergency Medicine

## 2024-02-06 DIAGNOSIS — N4889 Other specified disorders of penis: Secondary | ICD-10-CM | POA: Diagnosis present

## 2024-02-06 DIAGNOSIS — Z113 Encounter for screening for infections with a predominantly sexual mode of transmission: Secondary | ICD-10-CM | POA: Insufficient documentation

## 2024-02-06 LAB — HIV ANTIBODY (ROUTINE TESTING W REFLEX): HIV Screen 4th Generation wRfx: NONREACTIVE

## 2024-02-06 NOTE — Discharge Instructions (Signed)
 Your results will come back over the next few days and someone will call if results are positive and require treatment.   [facility] for further evaluation of the lump on your penis.  You can also follow-up with your primary care provider who can provide a referral or possibly even evaluation of this as well.   Return here as needed.
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
## 2024-02-06 NOTE — ED Triage Notes (Signed)
 Pt is here for STD testing. Patient states she noticed a bump on his penis this morning.

## 2024-02-07 LAB — CYTOLOGY, (ORAL, ANAL, URETHRAL) ANCILLARY ONLY
Chlamydia: NEGATIVE
Comment: NEGATIVE
Comment: NEGATIVE
Comment: NORMAL
Neisseria Gonorrhea: NEGATIVE
Trichomonas: NEGATIVE

## 2024-02-07 LAB — SYPHILIS: RPR W/REFLEX TO RPR TITER AND TREPONEMAL ANTIBODIES, TRADITIONAL SCREENING AND DIAGNOSIS ALGORITHM: RPR Ser Ql: NONREACTIVE

## 2024-05-11 ENCOUNTER — Encounter: Payer: Self-pay | Admitting: Nurse Practitioner
